# Patient Record
Sex: Female | Born: 1955
Health system: Southern US, Community
[De-identification: ages and names within clinical notes are randomized; demographics above are authoritative.]

## PROBLEM LIST (undated history)

## (undated) DIAGNOSIS — N912 Amenorrhea, unspecified: Secondary | ICD-10-CM

## (undated) DIAGNOSIS — N6009 Solitary cyst of unspecified breast: Secondary | ICD-10-CM

## (undated) DIAGNOSIS — R933 Abnormal findings on diagnostic imaging of other parts of digestive tract: Secondary | ICD-10-CM

## (undated) DIAGNOSIS — N644 Mastodynia: Secondary | ICD-10-CM

## (undated) DIAGNOSIS — D219 Benign neoplasm of connective and other soft tissue, unspecified: Secondary | ICD-10-CM

## (undated) DIAGNOSIS — R102 Pelvic and perineal pain: Secondary | ICD-10-CM

## (undated) DIAGNOSIS — K602 Anal fissure, unspecified: Secondary | ICD-10-CM

## (undated) DIAGNOSIS — Z803 Family history of malignant neoplasm of breast: Secondary | ICD-10-CM

## (undated) DIAGNOSIS — E739 Lactose intolerance, unspecified: Secondary | ICD-10-CM

## (undated) DIAGNOSIS — I10 Essential (primary) hypertension: Secondary | ICD-10-CM

## (undated) HISTORY — DX: Benign neoplasm of connective and other soft tissue, unspecified: D21.9

## (undated) HISTORY — DX: Anal fissure, unspecified: K60.2

## (undated) HISTORY — DX: Solitary cyst of unspecified breast: N60.09

## (undated) HISTORY — DX: Abnormal findings on diagnostic imaging of other parts of digestive tract: R93.3

## (undated) HISTORY — PX: DIAGNOSTIC LAPAROSCOPY: SUR761

## (undated) HISTORY — DX: Mastodynia: N64.4

## (undated) HISTORY — DX: Amenorrhea, unspecified: N91.2

## (undated) HISTORY — DX: Lactose intolerance, unspecified: E73.9

## (undated) HISTORY — DX: Family history of malignant neoplasm of breast: Z80.3

## (undated) HISTORY — DX: Pelvic and perineal pain: R10.2

---
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
## 1997-08-17 ENCOUNTER — Ambulatory Visit (HOSPITAL_COMMUNITY): Admission: RE | Admit: 1997-08-17 | Discharge: 1997-08-17 | Payer: Self-pay | Admitting: Obstetrics and Gynecology

## 1997-08-30 ENCOUNTER — Ambulatory Visit (HOSPITAL_COMMUNITY): Admission: RE | Admit: 1997-08-30 | Discharge: 1997-08-30 | Payer: Self-pay | Admitting: Obstetrics and Gynecology

## 1999-11-16 ENCOUNTER — Encounter: Admission: RE | Admit: 1999-11-16 | Discharge: 1999-11-16 | Payer: Self-pay | Admitting: *Deleted

## 1999-11-16 ENCOUNTER — Other Ambulatory Visit: Admission: RE | Admit: 1999-11-16 | Discharge: 1999-11-16 | Payer: Self-pay | Admitting: *Deleted

## 1999-11-16 ENCOUNTER — Encounter: Payer: Self-pay | Admitting: *Deleted

## 1999-11-16 ENCOUNTER — Encounter (INDEPENDENT_AMBULATORY_CARE_PROVIDER_SITE_OTHER): Payer: Self-pay | Admitting: Specialist

## 2000-06-30 ENCOUNTER — Encounter (HOSPITAL_BASED_OUTPATIENT_CLINIC_OR_DEPARTMENT_OTHER): Payer: Self-pay | Admitting: General Surgery

## 2000-07-02 ENCOUNTER — Ambulatory Visit (HOSPITAL_COMMUNITY): Admission: RE | Admit: 2000-07-02 | Discharge: 2000-07-02 | Payer: Self-pay | Admitting: General Surgery

## 2000-07-02 ENCOUNTER — Encounter (HOSPITAL_BASED_OUTPATIENT_CLINIC_OR_DEPARTMENT_OTHER): Payer: Self-pay | Admitting: General Surgery

## 2000-07-02 ENCOUNTER — Encounter (INDEPENDENT_AMBULATORY_CARE_PROVIDER_SITE_OTHER): Payer: Self-pay | Admitting: *Deleted

## 2000-09-29 ENCOUNTER — Encounter: Admission: RE | Admit: 2000-09-29 | Discharge: 2000-09-29 | Payer: Self-pay | Admitting: Geriatric Medicine

## 2000-09-29 ENCOUNTER — Encounter: Payer: Self-pay | Admitting: Geriatric Medicine

## 2000-12-24 ENCOUNTER — Encounter: Admission: RE | Admit: 2000-12-24 | Discharge: 2000-12-24 | Payer: Self-pay | Admitting: Geriatric Medicine

## 2000-12-24 ENCOUNTER — Encounter: Payer: Self-pay | Admitting: Geriatric Medicine

## 2001-04-15 HISTORY — PX: BRAIN SURGERY: SHX531

## 2001-06-23 ENCOUNTER — Encounter: Payer: Self-pay | Admitting: Geriatric Medicine

## 2001-06-23 ENCOUNTER — Encounter: Admission: RE | Admit: 2001-06-23 | Discharge: 2001-06-23 | Payer: Self-pay | Admitting: Geriatric Medicine

## 2001-10-12 ENCOUNTER — Other Ambulatory Visit: Admission: RE | Admit: 2001-10-12 | Discharge: 2001-10-12 | Payer: Self-pay | Admitting: Obstetrics and Gynecology

## 2001-10-14 ENCOUNTER — Encounter: Payer: Self-pay | Admitting: Obstetrics and Gynecology

## 2001-10-14 ENCOUNTER — Encounter: Admission: RE | Admit: 2001-10-14 | Discharge: 2001-10-14 | Payer: Self-pay | Admitting: Obstetrics and Gynecology

## 2002-07-30 ENCOUNTER — Encounter: Admission: RE | Admit: 2002-07-30 | Discharge: 2002-07-30 | Payer: Self-pay | Admitting: Obstetrics and Gynecology

## 2002-07-30 ENCOUNTER — Encounter: Payer: Self-pay | Admitting: Obstetrics and Gynecology

## 2002-10-04 ENCOUNTER — Encounter: Admission: RE | Admit: 2002-10-04 | Discharge: 2002-10-04 | Payer: Self-pay | Admitting: Geriatric Medicine

## 2002-10-04 ENCOUNTER — Encounter: Payer: Self-pay | Admitting: Geriatric Medicine

## 2003-01-20 ENCOUNTER — Other Ambulatory Visit: Admission: RE | Admit: 2003-01-20 | Discharge: 2003-01-20 | Payer: Self-pay | Admitting: Obstetrics and Gynecology

## 2003-05-16 ENCOUNTER — Encounter: Admission: RE | Admit: 2003-05-16 | Discharge: 2003-05-16 | Payer: Self-pay | Admitting: Geriatric Medicine

## 2003-10-28 ENCOUNTER — Encounter: Admission: RE | Admit: 2003-10-28 | Discharge: 2003-10-28 | Payer: Self-pay | Admitting: Gastroenterology

## 2004-09-30 ENCOUNTER — Emergency Department (HOSPITAL_COMMUNITY): Admission: EM | Admit: 2004-09-30 | Discharge: 2004-09-30 | Payer: Self-pay | Admitting: Family Medicine

## 2004-10-17 ENCOUNTER — Ambulatory Visit (HOSPITAL_COMMUNITY): Admission: RE | Admit: 2004-10-17 | Discharge: 2004-10-17 | Payer: Self-pay | Admitting: General Surgery

## 2005-04-01 ENCOUNTER — Other Ambulatory Visit: Admission: RE | Admit: 2005-04-01 | Discharge: 2005-04-01 | Payer: Self-pay | Admitting: Obstetrics and Gynecology

## 2005-04-02 ENCOUNTER — Encounter: Admission: RE | Admit: 2005-04-02 | Discharge: 2005-04-02 | Payer: Self-pay | Admitting: Obstetrics and Gynecology

## 2005-08-05 ENCOUNTER — Encounter: Admission: RE | Admit: 2005-08-05 | Discharge: 2005-08-05 | Payer: Self-pay | Admitting: Orthopedic Surgery

## 2005-09-17 ENCOUNTER — Encounter: Admission: RE | Admit: 2005-09-17 | Discharge: 2005-09-17 | Payer: Self-pay | Admitting: Geriatric Medicine

## 2005-11-07 ENCOUNTER — Encounter: Admission: RE | Admit: 2005-11-07 | Discharge: 2005-11-07 | Payer: Self-pay | Admitting: Obstetrics and Gynecology

## 2005-11-27 ENCOUNTER — Emergency Department (HOSPITAL_COMMUNITY): Admission: EM | Admit: 2005-11-27 | Discharge: 2005-11-28 | Payer: Self-pay | Admitting: Emergency Medicine

## 2006-05-12 ENCOUNTER — Encounter: Admission: RE | Admit: 2006-05-12 | Discharge: 2006-05-12 | Payer: Self-pay | Admitting: Obstetrics and Gynecology

## 2007-09-21 ENCOUNTER — Encounter: Admission: RE | Admit: 2007-09-21 | Discharge: 2007-09-21 | Payer: Self-pay | Admitting: Obstetrics and Gynecology

## 2008-03-23 ENCOUNTER — Encounter: Admission: RE | Admit: 2008-03-23 | Discharge: 2008-03-23 | Payer: Self-pay | Admitting: Geriatric Medicine

## 2008-09-27 ENCOUNTER — Encounter: Admission: RE | Admit: 2008-09-27 | Discharge: 2008-09-27 | Payer: Self-pay | Admitting: Obstetrics and Gynecology

## 2009-05-18 ENCOUNTER — Encounter: Admission: RE | Admit: 2009-05-18 | Discharge: 2009-05-18 | Payer: Self-pay | Admitting: Internal Medicine

## 2009-07-19 ENCOUNTER — Encounter: Admission: RE | Admit: 2009-07-19 | Discharge: 2009-07-19 | Payer: Self-pay | Admitting: Geriatric Medicine

## 2009-10-02 ENCOUNTER — Encounter: Admission: RE | Admit: 2009-10-02 | Discharge: 2009-10-02 | Payer: Self-pay | Admitting: Obstetrics and Gynecology

## 2009-11-17 ENCOUNTER — Encounter: Admission: RE | Admit: 2009-11-17 | Discharge: 2009-11-17 | Payer: Self-pay | Admitting: Internal Medicine

## 2010-04-13 ENCOUNTER — Encounter
Admission: RE | Admit: 2010-04-13 | Discharge: 2010-04-13 | Payer: Self-pay | Source: Home / Self Care | Attending: Obstetrics and Gynecology | Admitting: Obstetrics and Gynecology

## 2010-04-13 ENCOUNTER — Encounter
Admission: RE | Admit: 2010-04-13 | Discharge: 2010-04-13 | Payer: Self-pay | Source: Home / Self Care | Attending: Geriatric Medicine | Admitting: Geriatric Medicine

## 2010-05-06 ENCOUNTER — Encounter: Payer: Self-pay | Admitting: Geriatric Medicine

## 2010-05-06 ENCOUNTER — Encounter: Payer: Self-pay | Admitting: Obstetrics and Gynecology

## 2010-05-07 ENCOUNTER — Encounter: Payer: Self-pay | Admitting: Geriatric Medicine

## 2010-08-31 NOTE — Op Note (Signed)
[place]  Patient:    Barbara Guerrero, Barbara Guerrero                      MRN: [id number] Proc. Date: 07/02/00 Attending:  Luisa Hart L. Lurene Shadow, M.D.                           Operative Report  PREOPERATIVE DIAGNOSIS:  Fibroadenoma, left breast.  POSTOPERATIVE DIAGNOSIS:  Fibroadenoma, left breast, pathology pending.  OPERATION PERFORMED:  Excisional biopsy of left breast mass.  SURGEON:  Mardene Celeste. Lurene Shadow, M.D.  ASSISTANT:  Nurse.  ANESTHESIA:  General.  INDICATIONS FOR PROCEDURE:  The patient is a 55 year old woman with a nonpalpable left breast mass which on ultrasound was biopsied by core biopsy showing a fibroadenoma; however, because of this patients strong family history of breast cancer in a sister, she elects to have the entire mass removed.  She was brought to the operating room for same.  DESCRIPTION OF PROCEDURE:  Following the induction of anesthesia, the patient was positioned supinely and the left breast was prepped and draped to be included in a sterile operative field.  The incision was made near the region of the localizing needle over the designated region of the mass, deepened through the skin and subcutaneous tissues and carried down to the mass.  The mass was excised in its entirety along with the localizing needle and forwarded for specimen mammography.  Specimen mammography confirms presence of a mass in the excised tissue.  The mass was then forwarded for pathological evaluation and pathology is pending.  Hemostasis was assured with electrocautery.  Sponge, instrument and sharp counts were verified.  The wound was closed in two layers using interrupted 3-0 Vicryl sutures in the deep layers and a running 5-0 Monocryl in the skin.  The wound was then reinforced with Steri-Strips and sterile dressings were applied.  Anesthetic reversed. Patient removed from the operating room to the recovery room in stable condition having tolerated  the procedure well. DD:  07/02/00 TD:  07/02/00 Job: [id number]

## 2011-01-29 ENCOUNTER — Other Ambulatory Visit: Payer: Self-pay | Admitting: Orthopedic Surgery

## 2011-01-29 DIAGNOSIS — M25562 Pain in left knee: Secondary | ICD-10-CM

## 2011-01-30 ENCOUNTER — Ambulatory Visit
Admission: RE | Admit: 2011-01-30 | Discharge: 2011-01-30 | Disposition: A | Discharge status: Home / Self Care | Payer: BC Managed Care – PPO | Source: Ambulatory Visit | Attending: Orthopedic Surgery | Admitting: Orthopedic Surgery

## 2011-01-30 DIAGNOSIS — M25562 Pain in left knee: Secondary | ICD-10-CM

## 2011-03-19 ENCOUNTER — Other Ambulatory Visit: Payer: Self-pay | Admitting: Orthopedic Surgery

## 2011-03-19 ENCOUNTER — Other Ambulatory Visit: Payer: BC Managed Care – PPO

## 2011-03-19 DIAGNOSIS — M79669 Pain in unspecified lower leg: Secondary | ICD-10-CM

## 2011-08-16 ENCOUNTER — Encounter (HOSPITAL_COMMUNITY): Payer: Self-pay | Admitting: Emergency Medicine

## 2011-08-16 ENCOUNTER — Emergency Department (HOSPITAL_COMMUNITY)
Admission: EM | Admit: 2011-08-16 | Discharge: 2011-08-16 | Disposition: A | Discharge status: Home / Self Care | Payer: BC Managed Care – PPO | Source: Home / Self Care | Attending: Family Medicine | Admitting: Family Medicine

## 2011-08-16 DIAGNOSIS — J069 Acute upper respiratory infection, unspecified: Secondary | ICD-10-CM

## 2011-08-16 LAB — POCT RAPID STREP A: Streptococcus, Group A Screen (Direct): NEGATIVE

## 2011-08-16 MED ORDER — AZITHROMYCIN 250 MG PO TABS: 250.0000 mg | ORAL_TABLET | Freq: Every day | ORAL | Status: AC

## 2011-08-16 NOTE — Discharge Instructions (Signed)
Your exam is not concerning for a bacterial infection, and this is most likely viral. I recommend supportive care with fever and pain control with ibuprofen 600 mg every 6 hours or 800 mg every 8 hours; you may alternate with Tylenol (acetaminophen). Use nasal saline, available over the counter in brands such as Ayr, or Arm & Hammer, for nasal congestion and runny nose. If no improvement in symptoms in 48 hours, fill antibiotic prescription. Return to care should your symptoms not improve, or worsen in any way.

## 2011-08-16 NOTE — ED Notes (Signed)
PT HERE WITH SINUS SX SORE THROAT,RUNNY NOSE,PAIN BEHIND EYES AND BODY ACHES THAT STARTED X 4 DYS AGO UNRELIEVED BY OTC BENADRYL,TYLENOL.TEMP 100.4

## 2011-08-16 NOTE — ED Provider Notes (Signed)
History     CSN: [id number]  Arrival date & time 08/16/11  4098   First MD Initiated Contact with Patient 08/16/11 1006      Chief Complaint  Patient presents with  . Sinusitis  . URI    (Consider location/radiation/quality/duration/timing/severity/associated sxs/prior treatment) HPI Comments: Barbara Guerrero presents for evaluation of URI symptoms of rhinorrhea, postnasal drainage, sore throat, and body aches over the last 4 days. She denies any cough. She does report fever, has been taking over-the-counter preparations such as Benadryl and Tylenol with mild relief. She also reports a history of coughing up small like balls; she thinks they come from her tonsils.  Patient is a 56 y.o. female presenting with URI.  URI The primary symptoms include fever, fatigue, sore throat and myalgias. Primary symptoms do not include cough. The current episode started 3 to 5 days ago. This is a new problem. The problem has not changed since onset. The fever began 3 to 5 days ago. The fever has been unchanged since its onset. The maximum temperature recorded prior to her arrival was unknown.  The fatigue began 3 to 5 days ago. The fatigue has been unchanged since its onset.  The sore throat began more than 2 days ago. The sore throat has been unchanged since its onset. The sore throat is mild in intensity. The sore throat is not accompanied by trouble swallowing.  Symptoms associated with the illness include congestion and rhinorrhea. The following treatments were addressed: Acetaminophen was effective.    Past Medical History  Diagnosis Date  . Asthma     Past Surgical History  Procedure Date  . Brain surgery   . Cesarean section     No family history on file.  History  Substance Use Topics  . Smoking status: Never Smoker   . Smokeless tobacco: Not on file  . Alcohol Use: No    OB History    Grav Para Term Preterm Abortions TAB SAB Ect Mult Living                  Review of Systems    Constitutional: Positive for fever and fatigue.  HENT: Positive for congestion, sore throat, rhinorrhea and postnasal drip. Negative for trouble swallowing.   Eyes: Negative.   Respiratory: Negative.  Negative for cough.   Cardiovascular: Negative.   Gastrointestinal: Negative.   Genitourinary: Negative.   Musculoskeletal: Positive for myalgias.  Skin: Negative.   Neurological: Negative.     Allergies  Review of patient's allergies indicates no known allergies.  Home Medications   Current Outpatient Rx  Name Route Sig Dispense Refill  . ALBUTEROL SULFATE HFA 108 (90 BASE) MCG/ACT IN AERS Inhalation Inhale 2 puffs into the lungs every 6 (six) hours as needed.    . AZITHROMYCIN 250 MG PO TABS Oral Take 1 tablet (250 mg total) by mouth daily. Take two tablets on first day, then one tablet each day for four days 6 tablet 0    BP 116/75  Pulse 84  Temp(Src) 100.4 F (38 C) (Oral)  Resp 16  SpO2 94%  Physical Exam  Nursing note and vitals reviewed. Constitutional: She is oriented to person, place, and time. She appears well-developed and well-nourished.  HENT:  Head: Normocephalic and atraumatic.  Right Ear: Tympanic membrane normal.  Left Ear: Tympanic membrane normal.  Mouth/Throat: Uvula is midline, oropharynx is clear and moist and mucous membranes are normal.    Eyes: EOM are normal.  Neck: Normal range of motion.  Cardiovascular: Normal rate and regular rhythm.   Pulmonary/Chest: Effort normal and breath sounds normal. She has no decreased breath sounds. She has no wheezes. She has no rhonchi.  Musculoskeletal: Normal range of motion.  Neurological: She is alert and oriented to person, place, and time.  Skin: Skin is warm and dry.  Psychiatric: Her behavior is normal.    ED Course  Procedures (including critical care time)   Labs Reviewed  POCT RAPID STREP A ([facility] CARE ONLY)   No results found.   1. URI (upper respiratory infection)       MDM   Exam unremarkable; Rapid strep negative; continue supportive care, given delayed rx for azithromycin if no improvement in sx        Renaee Munda, MD 08/16/11 1210

## 2011-11-12 ENCOUNTER — Encounter: Payer: Self-pay | Admitting: Obstetrics and Gynecology

## 2011-11-12 ENCOUNTER — Ambulatory Visit (INDEPENDENT_AMBULATORY_CARE_PROVIDER_SITE_OTHER): Payer: BC Managed Care – PPO | Admitting: Obstetrics and Gynecology

## 2011-11-12 VITALS — BP 122/62 | Ht 63.75 in | Wt 226.0 lb

## 2011-11-12 DIAGNOSIS — D259 Leiomyoma of uterus, unspecified: Secondary | ICD-10-CM

## 2011-11-12 DIAGNOSIS — Z803 Family history of malignant neoplasm of breast: Secondary | ICD-10-CM

## 2011-11-12 DIAGNOSIS — R3915 Urgency of urination: Secondary | ICD-10-CM

## 2011-11-12 DIAGNOSIS — Z124 Encounter for screening for malignant neoplasm of cervix: Secondary | ICD-10-CM

## 2011-11-12 DIAGNOSIS — D219 Benign neoplasm of connective and other soft tissue, unspecified: Secondary | ICD-10-CM

## 2011-11-12 NOTE — Progress Notes (Signed)
AEX VISIT Last Pap: 11/01/2009 WNL: Yes No hx abnormal pap Regular Periods:no Contraception: post menopausal   Monthly Breast exam:yes Tetanus<23yrs:yes Nl.Bladder Function:yes Daily BMs:yes Healthy Diet:yes Calcium:no Mammogram:yes Date of Mammogram: 03/2010 Exercise:no Have often Exercise: n/a Seatbelt: yes Abuse at home: no Stressful work:no Sigmoid-colonoscopy: 2011 WNL  Bone Density: Yes  PCP: [facility] Change in PMH: none.  Known first degree relative (sister ) with breast cancer Change in [facility]: none Subjective:    Barbara Guerrero is a 56 y.o. female, G2P2, who presents for an annual exam.     History   Social History  . Marital Status: Married    Spouse Name: N/A    Number of Children: N/A  . Years of Education: N/A   Social History Main Topics  . Smoking status: Never Smoker   . Smokeless tobacco: Never Used  . Alcohol Use: No  . Drug Use: No  . Sexually Active: Yes    Birth Control/ Protection: Post-menopausal   Other Topics Concern  . None   Social History Narrative  . None    Menstrual cycle:   LMP: No LMP recorded. Patient is postmenopausal.           Cycle: none  The following portions of the patient's history were reviewed and updated as appropriate: allergies, current medications, past family history, past medical history, past social history, past surgical history and problem list.  Review of Systems Pertinent items are noted in HPI. Breast:Negative for breast lump,nipple discharge or nipple retraction Gastrointestinal: Negative for abdominal pain, change in bowel habits or rectal bleeding Urinary:negative   Objective:    BP 122/62  Ht 5' 3.75" (1.619 m)  Wt 226 lb (102.513 kg)  BMI 39.10 kg/m2    Weight:  Wt Readings from Last 1 Encounters:  11/12/11 226 lb (102.513 kg)          BMI: Body mass index is 39.10 kg/(m^2).  General Appearance: Alert, appropriate appearance for age. No acute distress HEENT: Grossly normal Neck /  Thyroid: Supple, no masses, nodes or enlargement Lungs: clear to auscultation bilaterally Back: No CVA tenderness Breast Exam: bilateral fibrocystic changes and No masses or nodes.No dimpling, nipple retraction or discharge. Cardiovascular: Regular rate and rhythm. S1, S2, no murmur Gastrointestinal: Soft, non-tender, no masses or organomegaly Pelvic Exam: External genitalia: normal general appearance Vaginal: normal mucosa without prolapse or lesions Cervix: normal appearance Adnexa: no masses but exam compromised by patient habitus Uterus: upper limits normal size and irregular Rectal: no masses Rectovaginal: no masses Lymphatic Exam: Non-palpable nodes in neck, clavicular, axillary, or inguinal regions  Skin: no rash or abnormalities Neurologic: Normal gait and speech, no tremor  Psychiatric: Alert and oriented, appropriate affect.   Wet Prep:not applicable Urinalysis:not applicable UPT: Not done   Assessment:    asymptomatic fibroids    Plan:   Pap with HR HPV every 3 yrs mammogram return annually or prn        HAYGOOD,VANESSA PMD

## 2011-11-13 LAB — PAP IG AND HPV HIGH-RISK

## 2011-11-18 ENCOUNTER — Other Ambulatory Visit: Payer: Self-pay | Admitting: Obstetrics and Gynecology

## 2011-11-18 DIAGNOSIS — N644 Mastodynia: Secondary | ICD-10-CM

## 2012-04-20 ENCOUNTER — Ambulatory Visit
Admission: RE | Admit: 2012-04-20 | Discharge: 2012-04-20 | Disposition: A | Discharge status: Home / Self Care | Payer: BC Managed Care – PPO | Source: Ambulatory Visit | Attending: Obstetrics and Gynecology | Admitting: Obstetrics and Gynecology

## 2012-04-20 DIAGNOSIS — N644 Mastodynia: Secondary | ICD-10-CM

## 2013-07-13 ENCOUNTER — Other Ambulatory Visit: Payer: Self-pay

## 2013-07-13 DIAGNOSIS — Z1231 Encounter for screening mammogram for malignant neoplasm of breast: Secondary | ICD-10-CM

## 2013-07-22 ENCOUNTER — Ambulatory Visit: Payer: BC Managed Care – PPO

## 2013-08-03 ENCOUNTER — Ambulatory Visit
Admission: RE | Admit: 2013-08-03 | Discharge: 2013-08-03 | Disposition: A | Discharge status: Home / Self Care | Payer: BC Managed Care – PPO | Source: Ambulatory Visit

## 2013-08-03 ENCOUNTER — Encounter (INDEPENDENT_AMBULATORY_CARE_PROVIDER_SITE_OTHER): Payer: Self-pay

## 2013-08-03 DIAGNOSIS — Z1231 Encounter for screening mammogram for malignant neoplasm of breast: Secondary | ICD-10-CM

## 2013-11-28 ENCOUNTER — Encounter (HOSPITAL_COMMUNITY): Payer: Self-pay | Admitting: Emergency Medicine

## 2013-11-28 ENCOUNTER — Emergency Department (HOSPITAL_COMMUNITY)
Admission: EM | Admit: 2013-11-28 | Discharge: 2013-11-28 | Disposition: A | Discharge status: Home / Self Care | Payer: BC Managed Care – PPO | Attending: Emergency Medicine | Admitting: Emergency Medicine

## 2013-11-28 DIAGNOSIS — Z8719 Personal history of other diseases of the digestive system: Secondary | ICD-10-CM | POA: Insufficient documentation

## 2013-11-28 DIAGNOSIS — Z8639 Personal history of other endocrine, nutritional and metabolic disease: Secondary | ICD-10-CM | POA: Diagnosis not present

## 2013-11-28 DIAGNOSIS — Z862 Personal history of diseases of the blood and blood-forming organs and certain disorders involving the immune mechanism: Secondary | ICD-10-CM | POA: Diagnosis not present

## 2013-11-28 DIAGNOSIS — Z8742 Personal history of other diseases of the female genital tract: Secondary | ICD-10-CM | POA: Diagnosis not present

## 2013-11-28 DIAGNOSIS — G44209 Tension-type headache, unspecified, not intractable: Secondary | ICD-10-CM | POA: Diagnosis not present

## 2013-11-28 DIAGNOSIS — J45909 Unspecified asthma, uncomplicated: Secondary | ICD-10-CM | POA: Diagnosis not present

## 2013-11-28 DIAGNOSIS — R51 Headache: Secondary | ICD-10-CM | POA: Insufficient documentation

## 2013-11-28 MED ORDER — ACETAMINOPHEN 500 MG PO TABS
1000.0000 mg | ORAL_TABLET | Freq: Once | ORAL | Status: AC
  Administered 2013-11-28: 1000 mg via ORAL
  Filled 2013-11-28: qty 2

## 2013-11-28 NOTE — ED Notes (Addendum)
Pt reports having a headache for the past three days. Pt states that she has been taking a pain medication prescribed by her orthopedic doctor for the past two weeks. Pt reports that when she closed her eyes on the way over, it improved her pain. Pt denies nausea and emesis. Pt is A/O x4, in NAD, and vitals are WDL.

## 2013-11-28 NOTE — ED Provider Notes (Signed)
CSN: [id number]     Arrival date & time 11/28/13  1845 History   First MD Initiated Contact with Patient 11/28/13 1900     Chief Complaint  Patient presents with  . Headache     (Consider location/radiation/quality/duration/timing/severity/associated sxs/prior Treatment) HPI Barbara Guerrero is a 58 y.o. female who is here for evaluation of headache. The headache started 3 days ago she describes it as a tension-type headache right on top of her head. She cannot remember what brought the HA on. The pain does not wake her up at night. She has not tried anything for the headache, she says that closing her eyes and resting makes it feel better. She said her headache feels much better now than it did when she arrived. She says the only medication she takes now is diclofenac for knee pain as prescribed by her orthopedist.She denies any fevers, nausea, vomiting, visual disturbances, weakness, confusion.   Past Medical History  Diagnosis Date  . Asthma   . Breast cyst   . Fibroids   . Pelvic pain in female   . Barium enema abnormal   . Lactose intolerance   . Mastodynia   . Amenorrhea   . Rectal fissure   . Family history of breast cancer in first degree relative    Past Surgical History  Procedure Laterality Date  . Brain surgery  2003  . Cesarean section  1980/1985  . Breast surgery  1977  . Diagnostic laparoscopy     Family History  Problem Relation Age of Onset  . Hypertension Mother   . Cancer Sister     breast cancer   History  Substance Use Topics  . Smoking status: Never Smoker   . Smokeless tobacco: Never Used  . Alcohol Use: No   OB History   Grav Para Term Preterm Abortions TAB SAB Ect Mult Living   2 2             Review of Systems  Constitutional: Negative for fever and fatigue.  HENT: Negative for sore throat.   Eyes: Negative for visual disturbance.  Respiratory: Negative for shortness of breath.   Cardiovascular: Negative for chest pain.   Gastrointestinal: Negative for abdominal pain.  Musculoskeletal: Negative for neck pain and neck stiffness.  Skin: Negative for rash.  Neurological: Positive for headaches. Negative for weakness and numbness.      Allergies  Shellfish allergy  Home Medications   Prior to Admission medications   Medication Sig Start Date End Date Taking? Authorizing Provider  albuterol (PROVENTIL HFA;VENTOLIN HFA) 108 (90 BASE) MCG/ACT inhaler Inhale 2 puffs into the lungs every 6 (six) hours as needed.    Historical Provider, MD   BP 164/89  Pulse 81  Temp(Src) 98.4 F (36.9 C) (Oral)  Resp 18  SpO2 97% Physical Exam  Nursing note and vitals reviewed. Constitutional: She is oriented to person, place, and time. She appears well-developed and well-nourished. No distress.  HENT:  Head: Normocephalic and atraumatic.  Mouth/Throat: Oropharynx is clear and moist.  Eyes: Conjunctivae and EOM are normal. Pupils are equal, round, and reactive to light. Right eye exhibits no discharge. Left eye exhibits no discharge.  Neck: Normal range of motion.  Cardiovascular: Normal rate, regular rhythm and normal heart sounds.   Pulmonary/Chest: Effort normal and breath sounds normal.  Abdominal: Soft. There is no tenderness.  Neurological: She is alert and oriented to person, place, and time.  Skin: Skin is warm and dry. No rash noted. She  is not diaphoretic.  Psychiatric: She has a normal mood and affect.    ED Course  Procedures (including critical care time) Labs Review Labs Reviewed - No data to display  Imaging Review No results found.   EKG Interpretation None      MDM  Pt resting comfortably in ED. Vitals stable. Description consistent with tension type HA. No concern for stroke, vascular disturbances, trauma, meningitis. Administered Tylenol in ED for symptom relief. Recommended recheck of BP in outpt setting. Final diagnoses:  None   Meds given in ED:  Medications  acetaminophen  (TYLENOL) tablet 1,000 mg (1,000 mg Oral Given 11/28/13 1929)    New Prescriptions   No medications on file   Prior to patient discharge, I discussed and reviewed this case with Dr.Ray      Verl Dicker, PA-C 11/28/13 2010

## 2013-11-28 NOTE — Discharge Instructions (Signed)
Continue to use Tylenol for symptom relief If you feel your headache gets worse, you experience fevers, nausea, vomiting, or experience vision changes- retur General Headache Without Cause A headache is pain or discomfort felt around the head or neck area. The specific cause of a headache may not be found. There are many causes and types of headaches. A few common ones are:  Tension headaches.  Migraine headaches.  Cluster headaches.  Chronic daily headaches. HOME CARE INSTRUCTIONS   Keep all follow-up appointments with your caregiver or any specialist referral.  Only take over-the-counter or prescription medicines for pain or discomfort as directed by your caregiver.  Lie down in a dark, quiet room when you have a headache.  Keep a headache journal to find out what may trigger your migraine headaches. For example, write down:  What you eat and drink.  How much sleep you get.  Any change to your diet or medicines.  Try massage or other relaxation techniques.  Put ice packs or heat on the head and neck. Use these 3 to 4 times per day for 15 to 20 minutes each time, or as needed.  Limit stress.  Sit up straight, and do not tense your muscles.  Quit smoking if you smoke.  Limit alcohol use.  Decrease the amount of caffeine you drink, or stop drinking caffeine.  Eat and sleep on a regular schedule.  Get 7 to 9 hours of sleep, or as recommended by your caregiver.  Keep lights dim if bright lights bother you and make your headaches worse. SEEK MEDICAL CARE IF:   You have problems with the medicines you were prescribed.  Your medicines are not working.  You have a change from the usual headache.  You have nausea or vomiting. SEEK IMMEDIATE MEDICAL CARE IF:   Your headache becomes severe.  You have a fever.  You have a stiff neck.  You have loss of vision.  You have muscular weakness or loss of muscle control.  You start losing your balance or have trouble  walking.  You feel faint or pass out.  You have severe symptoms that are different from your first symptoms. MAKE SURE YOU:   Understand these instructions.  Will watch your condition.  Will get help right away if you are not doing well or get worse. Document Released: 04/01/2005 Document Revised: 06/24/2011 Document Reviewed: 04/17/2011 [facility] Patient Information 2015 [place], [place]. This information is not intended to replace advice given to you by your health care provider. Make sure you discuss any questions you have with your health care provider. n to ED for further evaluation.    Emergency Department Resource Guide 1) Find a Doctor and Pay Out of Pocket Although you won't have to find out who is covered by your insurance plan, it is a good idea to ask around and get recommendations. You will then need to call the office and see if the doctor you have chosen will accept you as a new patient and what types of options they offer for patients who are self-pay. Some doctors offer discounts or will set up payment plans for their patients who do not have insurance, but you will need to ask so you aren't surprised when you get to your appointment.  2) Contact Your Local Health Department Not all health departments have doctors that can see patients for sick visits, but many do, so it is worth a call to see if yours does. If you don't know where your local  health department is, you can check in your phone book. The CDC also has a tool to help you locate your state's health department, and many state websites also have listings of all of their local health departments.  3) Find a [facility] If your illness is not likely to be very severe or complicated, you may want to try a walk in clinic. These are popping up all over the country in pharmacies, drugstores, and shopping centers. They're usually staffed by nurse practitioners or physician assistants that have been trained to treat  common illnesses and complaints. They're usually fairly quick and inexpensive. However, if you have serious medical issues or chronic medical problems, these are probably not your best option.  No Primary Care Doctor: - Call Health Connect at  [phone] - they can help you locate a primary care doctor that  accepts your insurance, provides certain services, etc. - Physician Referral Service- [phone]  Chronic Pain Problems: Organization         Address  Phone   Notes  [facility]  [phone] Patients need to be referred by their primary care doctor.   Medication Assistance: Organization         Address  Phone   Notes  [facility] Medication [facility] [address] [place], [place] [address] [phone] --Must be a resident of [facility] -- Must have NO insurance coverage whatsoever (no Medicaid/ Medicare, etc.) -- The pt. MUST have a primary care doctor that directs their care regularly and follows them in the community   Med[facility]ist  [phone]   [employer]  [phone]    Agencies that provide inexpensive medical care: Organization         Address  Phone   Notes  [place]  [phone]   [facility]    [phone]   [facility] [place], [place] [address] [phone]   [place] [address] [place] [phone]   Planned Parenthood    [phone]   [facility]    [phone]   [place] and [place], [place] Phone:  [phone], Fax:  [phone] Hours of Operation:  9 am - 6 pm, M-F.  Also accepts Medicaid/Medicare and self-pay.  [facility] for [place], [address], [place] Phone: [phone], Fax: [phone]. Hours of Operation:  8:30 am - 5:30 pm, M-F.  Also accepts Medicaid and self-pay.  [facility] [address] [place] Phone: [phone]   [place], [place], [place] [phone], Ext. [phone] Mondays & Thursdays: 7-9 AM.  First 15 patients are seen on a first come, first serve basis.    [place] Providers:  Organization         Address  Phone   Notes  [facility] [address], [place] [phone] Also accepts self-pay patients.  [place], [place]  [phone]   [place], [address], [place] [phone]   [facility] [address], [place] [phone]   Lucianne Lei [address], [place]   [phone] Only accepts [place] Access [place] patients after they  have their name applied to their card.   Self-Pay (no insurance) in [facility]:  Organization         Address  Phone   Notes  Sickle Cell Patients, [facility] [place] [phone]   [facility] [place] [phone]   [facility] [place]  [place], [place], [place] [phone]   [facility]/Dr. Osei-Bonsu  [address], [place] or [address], [place] [phone] Phone number for both [place] and [place] locations is the same.  [facility] [address] [place] [phone]   [facility] [address] [place] or [address] [phone]   [facility] [address] [place] [phone], phone; [phone], fax Sees patients 1st and 3rd Saturday of every month.  Must not qualify for public or private insurance (i.e. Medicaid, Medicare, [place] Health Choice, Veterans' Benefits)  Household income should be no more than 200% of the  poverty level The clinic cannot treat you if you are pregnant or think you are pregnant  Sexually transmitted diseases are not treated at the clinic.    Dental Care: Organization         Address  Phone  Notes  [facility] of [facility] [place] [phone] Accepts children up to age 47 who are enrolled in [place] or [place]; pregnant women with a Medicaid card; and children who have applied for Medicaid or [place] Health Choice, but were declined, whose parents can pay a reduced fee at time of service.  [facility] Department of [facility]  [address], [place] [phone] Accepts children up to age 62 who are enrolled in [place] or [place]; pregnant women with a Medicaid card; and children who have applied for Medicaid or  Health Choice, but were declined, whose parents can pay a reduced fee at time of service.  [facility] Adult Dental Access PROGRAM  [place] [phone] Patients are seen by appointment only. Walk-ins are not accepted. [place] will see patients 34 years of age and older. Monday - Tuesday (8am-5pm) Most Wednesdays (8:30-5pm) $30 per visit, cash only  [facility] Adult Dental Access PROGRAM  [address], [facility] [phone] Patients are seen by appointment only. Walk-ins are not accepted. [place] will see patients 68 years of age and older. One Wednesday Evening (Monthly: Volunteer Based).  $30 per visit, cash only  [place]  [phone] for adults; Children under age 55, call Graduate Pediatric Dentistry at [phone]. Children aged 88-14, please call [phone] to request a pediatric application.  Dental services are provided in all areas of dental care including fillings, crowns and bridges, complete and partial dentures, implants, gum treatment, root canals, and extractions.  Preventive care is also provided. Treatment is provided to both adults and children. Patients are selected via a lottery and there is often a waiting list.   [facility] [address] [place]  [phone] [email or web address], [place], [place] [phone], Ext. [phone] Second and Fourth Thursday of each month, opens at 6:30 AM; Clinic ends at 9 AM.  Patients are seen  on a first-come first-served basis, and a limited number are seen during each clinic.   [facility]  [address] Hillard Danker [place], [place] [phone]   Eligibility Requirements You must have lived in [place], [place], or [place] counties for at least the last three months.   You cannot be eligible for state or federal sponsored [employer], including [employer], [place], or [employer].   You generally cannot be eligible for healthcare insurance through your employer.    How to apply: Eligibility screenings are held every Tuesday and Wednesday afternoon from 1:00 pm until 4:00 pm. You do not need an appointment for the interview!  [facility] [address] [place], [place]   [place]  [place] Department  [place]  [phone]    Behavioral Health Resources in the Community: Intensive Outpatient Programs Organization         Address  Phone  Notes  [place]. [address] [place], [place] [phone]   [facility] Outpatient [address], [place], [place]   ADS: Alcohol & Drug Svcs [address], [place], [place]   [place] [address]  [place], [place] or [phone]   Substance Abuse Resources Organization         Address  Phone  Notes  Alcohol and Drug Services  [phone]   [place]  [phone]   The [place]   Chinita Pester  [phone]   Residential & Outpatient Substance Abuse Program  [phone]   Psychological Services Organization         Address  Phone  Notes  [facility] [place]  [place]  [phone]   [place] [address], [place] or [phone]    Mobile Crisis Teams Organization         Address  Phone  Notes  [facility]atives, Mobile Crisis Care Unit  [phone]   Assertive Psychotherapeutic Services  [address] [place], [place]   [place] [address] [place] [phone]    Self-Help/Support Groups Organization         Address  Phone             Notes  [place] of [place] - variety of support groups  [place] Call for more information  Narcotics Anonymous (NA), Caring Services [address], [employer] [place]  2 meetings at this location   Special educational needs teacher         Address  Phone  Notes  ASAP Residential Treatment [place],    [place]  [phone]   [facility]  [address] [place] [id number], [place], [place]   [place], [address] [phone] Admissions: 8am-3pm M-F  Incentives Substance [place] [address]    [place], [place] [phone]   [facility] [address] Jadene Pierini [place], [place]   [facility] [address]  [place], [place]   Insight Programs - Intensive Outpatient [address], [place], [place]   [facility] ([place] [address]  [place], [place] or [phone]   Residential Treatment Services (RTS) [place], [place]  [place] Medicaid  Fellowship [address],  [place]  [phone] Substance Abuse/Addiction Treatment   [facility] Organization         Address  Phone  Notes  [facility]  [phone]   Domenic Schwab, PhD [address] Arlis Porta [place], [place]   [phone] or [phone]   [place], [place] [phone]   [address], [place], [place] [phone] Insurance/Medicaid/sponsorship through [facility] and Families [address] [place]                                    [place], [place] [phone] [place] [address], [place] [phone]    Dr. Adele Schilder  [phone]   [facility] Dept. 1) [address], [place] 2) [place] 3)  [address], [place] [phone]  [phone]   [place] [phone] or [phone] (After Hours)

## 2013-11-29 NOTE — ED Provider Notes (Signed)
History/physical exam/procedure(s) were performed by non-physician practitioner and as supervising physician I was immediately available for consultation/collaboration. I have reviewed all notes and am in agreement with care and plan.   Shaune Pollack, MD 11/29/13 [phone]

## 2014-02-14 ENCOUNTER — Encounter (HOSPITAL_COMMUNITY): Payer: Self-pay | Admitting: Emergency Medicine

## 2014-07-11 ENCOUNTER — Encounter: Payer: Self-pay | Admitting: Podiatry

## 2014-07-11 ENCOUNTER — Ambulatory Visit (INDEPENDENT_AMBULATORY_CARE_PROVIDER_SITE_OTHER): Payer: BLUE CROSS/BLUE SHIELD | Admitting: Podiatry

## 2014-07-11 ENCOUNTER — Ambulatory Visit (INDEPENDENT_AMBULATORY_CARE_PROVIDER_SITE_OTHER): Payer: BLUE CROSS/BLUE SHIELD

## 2014-07-11 VITALS — BP 140/81 | HR 81 | Resp 12

## 2014-07-11 DIAGNOSIS — R52 Pain, unspecified: Secondary | ICD-10-CM | POA: Diagnosis not present

## 2014-07-11 DIAGNOSIS — M7741 Metatarsalgia, right foot: Secondary | ICD-10-CM

## 2014-07-11 DIAGNOSIS — M722 Plantar fascial fibromatosis: Secondary | ICD-10-CM | POA: Diagnosis not present

## 2014-07-11 NOTE — Progress Notes (Signed)
   Subjective:    Patient ID: Barbara Guerrero, female    DOB: Nov 29, 1955, 59 y.o.   MRN: [id number]  HPI  N- ACHING L-LT MEDIAL SIDE OF THE HEEL D-2 WEEKS O-SUDDENLY C-WORSE A-PRESSURE T-ICE, STRETCHING   N-UNCOMFORTABLE L-RT FOOT BALL OF THE FOOT D-? O-SUDDENLY C-SAME A-WEARING SHOES T-NONE  Patient has a history of knee pain and has had one cortisone injection into the knee today. She admits to having a painful gait pattern resulting from the pain She also has purchase systems sole over-the-counter soft arch pads which is wearing in her shoes  Review of Systems  Allergic/Immunologic: Positive for food allergies.       Objective:   Physical Exam  Orientated 3 patient presents with her daughter who is present in the treatment room today  Vascular: DP pulses 2/4 bilaterally PT pulses 2/4 bilaterally Capillary reflex immediate bilaterally  Neurological: Sensation to 10 g monofilament wire intact 5/5 bilaterally Vibratory sensation intact bilaterally Ankle reflex equal and reactive bilaterally  Dermatological: Texture and turgor within normal limits  Musculoskeletal: Palpable tenderness medial plantar fascial area in the left heel without any palpable lesions Mild palpable tenderness second and third right intermetatarsal spaces without any palpable lesions Mild palpable tenderness plantar subsecond MPJ right without any palpable lesions  There is no restriction ankle, subtalar, midtarsal joints bilaterally  X-ray examination weightbearing right foot  Intact bony structure without fracture and/or dislocation Inferior calcaneal spur HAV deformity Bone density appears adequate  Radiographic impression: No acute bony abnormality noted in the right foot   X-ray examination weightbearing left foot  Intact bony structure without fracture and/or dislocation Small inferior calcaneal spur Metatarsus adductus Bone density appears adequate  Radiographic  impression: No acute bony abnormality noted left foot     Result History        Assessment & Plan:   Assessment: Satisfactory neurovascular status bilaterally Plantar fasciitis left Metatarsalgia right versus the getting symptoms of neuroma second and third intermetatarsal space right Gait disturbance associated with painful right knee and plantar fasciitis left  Plan: Patient has had steroid injection for knee pain today and I will defer on any further steroid injections for the plantar fasciitis left We discussed the findings of the examination today Discuss in nature of her problems associated with plantar fasciitis left and altered gait pattern I attached a surgical felt pads to the existing shoe insoles for metatarsal raise 2-4 bilaterally Discuss correction shoeing and stretching  Reappoint 2 weeks

## 2014-07-11 NOTE — Patient Instructions (Signed)
Bent - Knee Calf Stretch  1) Stand an arm's length away from a wall. Place the palms of your hands on the wall. Step forward about 12 inches with the opposite foot.  2) Keeping toes pointed forward and both heels on the floor, bend both knees and lean forward. Hold this position for 60 seconds. Don't arch your back and don't hunch your shoulders.  3) Repeat this twice.  DO THIS STRETCHING TECHNIQUE 3 TIMES A DAY.   Stretching Exercises before Standing      Pull your toes up toward your nose and hold for 1 minute before standing.  A towel can assist with this exercise if you put the towel under the ball of your foot. This exercise reduces the intense    pain associated when changing from a seated to a standing position. This stretch can usually be the most beneficial if done before getting out of bed in the mornings. Plantar Fasciitis Plantar fasciitis is a common condition that causes foot pain. It is soreness (inflammation) of the band of tough fibrous tissue on the bottom of the foot that runs from the heel bone (calcaneus) to the ball of the foot. The cause of this soreness may be from excessive standing, poor fitting shoes, running on hard surfaces, being overweight, having an abnormal walk, or overuse (this is common in runners) of the painful foot or feet. It is also common in aerobic exercise dancers and ballet dancers. SYMPTOMS  Most people with plantar fasciitis complain of:  Severe pain in the morning on the bottom of their foot especially when taking the first steps out of bed. This pain recedes after a few minutes of walking.  Severe pain is experienced also during walking following a long period of inactivity.  Pain is worse when walking barefoot or up stairs DIAGNOSIS   Your caregiver will diagnose this condition by examining and feeling your foot.  Special tests such as X-rays of your foot, are usually not needed. PREVENTION   Consult a sports medicine professional  before beginning a new exercise program.  Walking programs offer a good workout. With walking there is a lower chance of overuse injuries common to runners. There is less impact and less jarring of the joints.  Begin all new exercise programs slowly. If problems or pain develop, decrease the amount of time or distance until you are at a comfortable level.  Wear good shoes and replace them regularly.  Stretch your foot and the heel cords at the back of the ankle (Achilles tendon) both before and after exercise.  Run or exercise on even surfaces that are not hard. For example, asphalt is better than pavement.  Do not run barefoot on hard surfaces.  If using a treadmill, vary the incline.  Do not continue to workout if you have foot or joint problems. Seek professional help if they do not improve. HOME CARE INSTRUCTIONS   Avoid activities that cause you pain until you recover.  Use ice or cold packs on the problem or painful areas after working out.  Only take over-the-counter or prescription medicines for pain, discomfort, or fever as directed by your caregiver.  Soft shoe inserts or athletic shoes with air or gel sole cushions may be helpful.  If problems continue or become more severe, consult a sports medicine caregiver or your own health care provider. Cortisone is a potent anti-inflammatory medication that may be injected into the painful area. You can discuss this treatment with your caregiver. MAKE   SURE YOU:   Understand these instructions.  Will watch your condition.  Will get help right away if you are not doing well or get worse. Document Released: 12/25/2000 Document Revised: 06/24/2011 Document Reviewed: 02/24/2008 ExitCare Patient Information 2015 [employer]. This information is not intended to replace advice given to you by your health care provider. Make sure you discuss any questions you have with your health care provider.  

## 2014-07-25 ENCOUNTER — Ambulatory Visit: Payer: BLUE CROSS/BLUE SHIELD | Admitting: Podiatry

## 2014-07-27 ENCOUNTER — Encounter: Payer: Self-pay | Admitting: Podiatry

## 2014-07-27 ENCOUNTER — Ambulatory Visit (INDEPENDENT_AMBULATORY_CARE_PROVIDER_SITE_OTHER): Payer: BLUE CROSS/BLUE SHIELD | Admitting: Podiatry

## 2014-07-27 VITALS — BP 123/77 | HR 79 | Resp 14

## 2014-07-27 DIAGNOSIS — M722 Plantar fascial fibromatosis: Secondary | ICD-10-CM | POA: Diagnosis not present

## 2014-07-27 MED ORDER — DICLOFENAC SODIUM 75 MG PO TBEC: 75.0000 mg | DELAYED_RELEASE_TABLET | Freq: Two times a day (BID) | ORAL | Status: DC

## 2014-07-27 NOTE — Patient Instructions (Signed)
Plantar Fasciitis  Plantar fasciitis is a common condition that causes foot pain. It is soreness (inflammation) of the band of tough fibrous tissue on the bottom of the foot that runs from the heel bone (calcaneus) to the ball of the foot. The cause of this soreness may be from excessive standing, poor fitting shoes, running on hard surfaces, being overweight, having an abnormal walk, or overuse (this is common in runners) of the painful foot or feet. It is also common in aerobic exercise dancers and ballet dancers.  SYMPTOMS   Most people with plantar fasciitis complain of:   Severe pain in the morning on the bottom of their foot especially when taking the first steps out of bed. This pain recedes after a few minutes of walking.   Severe pain is experienced also during walking following a long period of inactivity.   Pain is worse when walking barefoot or up stairs  DIAGNOSIS    Your caregiver will diagnose this condition by examining and feeling your foot.   Special tests such as X-rays of your foot, are usually not needed.  PREVENTION    Consult a sports medicine professional before beginning a new exercise program.   Walking programs offer a good workout. With walking there is a lower chance of overuse injuries common to runners. There is less impact and less jarring of the joints.   Begin all new exercise programs slowly. If problems or pain develop, decrease the amount of time or distance until you are at a comfortable level.   Wear good shoes and replace them regularly.   Stretch your foot and the heel cords at the back of the ankle (Achilles tendon) both before and after exercise.   Run or exercise on even surfaces that are not hard. For example, asphalt is better than pavement.   Do not run barefoot on hard surfaces.   If using a treadmill, vary the incline.   Do not continue to workout if you have foot or joint problems. Seek professional help if they do not improve.  HOME CARE INSTRUCTIONS     Avoid activities that cause you pain until you recover.   Use ice or cold packs on the problem or painful areas after working out.   Only take over-the-counter or prescription medicines for pain, discomfort, or fever as directed by your caregiver.   Soft shoe inserts or athletic shoes with air or gel sole cushions may be helpful.   If problems continue or become more severe, consult a sports medicine caregiver or your own health care provider. Cortisone is a potent anti-inflammatory medication that may be injected into the painful area. You can discuss this treatment with your caregiver.  MAKE SURE YOU:    Understand these instructions.   Will watch your condition.   Will get help right away if you are not doing well or get worse.  Document Released: 12/25/2000 Document Revised: 06/24/2011 Document Reviewed: 02/24/2008  [employer] Patient Information 2015 [employer]. This information is not intended to replace advice given to you by your health care provider. Make sure you discuss any questions you have with your health care provider.

## 2014-07-28 NOTE — Progress Notes (Signed)
Patient ID: Barbara Guerrero, female   DOB: 09/08/1955, 59 y.o.   MRN: [id number]  Subjective: Patient presents for follow-up care for plantar fasciitis left. At this time the left heel is still tender, however, improving with shoeing and stretching  Objective: There is palpable tenderness medial plantar fascial insertional area left without any palpable lesions. There is no surrounding erythema, edema noted in the area  Assessment: Plantar fasciitis left  Plan: I offered patient Kenalog injection and patient declined injection Rx diclofenac. Sodium 75 mg by mouth twice a day 30 days Maintain stretching and athletic style shoes  Reappoint at patient's request

## 2014-09-19 ENCOUNTER — Telehealth: Payer: Self-pay | Admitting: *Deleted

## 2014-09-19 NOTE — Telephone Encounter (Signed)
Left message asking pt to call with the name of the pharmacy and I would change the ordering pharmacy.

## 2014-09-19 NOTE — Telephone Encounter (Signed)
-----   Message from Clarene Reamer sent at 09/19/2014  2:02 PM EDT ----- Regarding: RX PHARMACY CHANGE PT WOULD LIKE TO HAVE LAST RX TO BE SENT TO [facility] INSTEAD OF WHATEVER PHARM IS LISTED. SHE NEEDS THE RS SENT TO THEM SHE STATES SHE HAS NEVER RECEIVED IT.

## 2014-12-01 ENCOUNTER — Encounter: Payer: Self-pay | Admitting: Podiatry

## 2014-12-01 ENCOUNTER — Ambulatory Visit (INDEPENDENT_AMBULATORY_CARE_PROVIDER_SITE_OTHER): Payer: BLUE CROSS/BLUE SHIELD | Admitting: Podiatry

## 2014-12-01 VITALS — BP 120/81 | HR 75 | Resp 16

## 2014-12-01 DIAGNOSIS — M722 Plantar fascial fibromatosis: Secondary | ICD-10-CM

## 2014-12-01 MED ORDER — TRIAMCINOLONE ACETONIDE 10 MG/ML IJ SUSP
10.0000 mg | Freq: Once | INTRAMUSCULAR | Status: AC
  Administered 2014-12-01: 10 mg

## 2014-12-01 MED ORDER — DICLOFENAC SODIUM 75 MG PO TBEC: 75.0000 mg | DELAYED_RELEASE_TABLET | Freq: Two times a day (BID) | ORAL | Status: DC

## 2014-12-01 NOTE — Progress Notes (Signed)
Subjective:     Patient ID: Barbara Guerrero, female   DOB: [birth year], 59 y.o.   MRN: [id number]  HPI patient presents with significant heel pain left of several months duration. Does not remember an injury that occurred. Patient has had previous treatment here including orthotics stretching exercises physical therapy and symptoms continue to give trouble both during the day and when waking up or after periods of sitting   Review of Systems     Objective:   Physical Exam Neurovascular status intact muscle strength adequate with exquisite discomfort in the plantar aspect of the left heel with inflammation and fluid in both the medial and central band of the fascia moderate depression of the arch noted upon weightbearing    Assessment:     Acute plantar fasciitis left with moderate depression of the arch and pain    Plan:     Condition reviewed with patient and discussed. Today I went ahead and I injected the plantar fascia 3 mg Kenalog 5 mg Xylocaine and dispensed fascial brace for daily usage. Do to intense night pain I did dispense a night splint with instructions on sleeping with this and patient will be reevaluated again in 4 weeks or earlier if any issues were to occur

## 2014-12-01 NOTE — Patient Instructions (Addendum)

## 2014-12-22 ENCOUNTER — Ambulatory Visit: Payer: BLUE CROSS/BLUE SHIELD | Admitting: Podiatry

## 2015-01-04 ENCOUNTER — Ambulatory Visit: Payer: BLUE CROSS/BLUE SHIELD | Admitting: Podiatry

## 2015-01-17 ENCOUNTER — Ambulatory Visit (INDEPENDENT_AMBULATORY_CARE_PROVIDER_SITE_OTHER): Payer: BLUE CROSS/BLUE SHIELD | Admitting: Allergy and Immunology

## 2015-01-17 ENCOUNTER — Other Ambulatory Visit: Payer: Self-pay | Admitting: Obstetrics and Gynecology

## 2015-01-17 ENCOUNTER — Encounter: Payer: Self-pay | Admitting: Allergy and Immunology

## 2015-01-17 VITALS — BP 122/84 | HR 72 | Temp 97.6°F | Resp 16

## 2015-01-17 DIAGNOSIS — J4530 Mild persistent asthma, uncomplicated: Secondary | ICD-10-CM

## 2015-01-17 DIAGNOSIS — T7800XD Anaphylactic reaction due to unspecified food, subsequent encounter: Secondary | ICD-10-CM | POA: Diagnosis not present

## 2015-01-17 DIAGNOSIS — J018 Other acute sinusitis: Secondary | ICD-10-CM

## 2015-01-17 DIAGNOSIS — Z1231 Encounter for screening mammogram for malignant neoplasm of breast: Secondary | ICD-10-CM

## 2015-01-17 MED ORDER — AZITHROMYCIN 500 MG PO TABS: 500.0000 mg | ORAL_TABLET | Freq: Every day | ORAL | Status: DC

## 2015-01-17 MED ORDER — ALBUTEROL SULFATE 108 (90 BASE) MCG/ACT IN AEPB: 2.0000 | INHALATION_SPRAY | RESPIRATORY_TRACT | Status: DC | PRN

## 2015-01-17 MED ORDER — EPINEPHRINE 0.3 MG/0.3ML IJ SOAJ: 0.3000 mg | Freq: Once | INTRAMUSCULAR | Status: DC

## 2015-01-17 NOTE — Addendum Note (Signed)
Addended by: Jiles Prows on: 01/17/2015 07:41 PM   Modules accepted: Miquel Dunn

## 2015-01-17 NOTE — Patient Instructions (Signed)
  1. Prednisone 10mg  one tablet one time per day for three days  2. Azithromycin 500mg  one tablet one time a day for three days  3. Nasal saline, OTC antihistamine, OTC ibuprofen, and OTC mucinex DM  4. Proair respiclick (with coupon) two pufs every 4-6 hours if needed  5. Epi-Pen for allergy reaction (Shellfish)  6. When better get a flu vaccine  7. Return in one year or earlier if there is a problem.

## 2015-01-17 NOTE — Progress Notes (Signed)
[facility]  Follow-up Note  Subjective  Barbara Guerrero is a 59 y.o. female who returns to the [facility] in re-evaluation of the following:  HPI Comments: . 1. Well controlled mild persistent asthma I have not seen Barbara Guerrero in this clinic since 2014 although she did visit with Dr. Shaune Leeks last spring for an exacerbation. Since that point in time she feels like she is doing well, has not had any exacerbations requiring steroids, does not have a limitation with exercise, and rarely uses her SABA. She is not on any controller agent.    2. Other acute sinusitis Barbara Guerrero has developed sore throat, nasal congestion, head fullness, ugly yellow green nasal discharge and anosmia with slight cough over the past 7-10 days. She is not improving.   3. 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 She needs an Epi-Pen prescription.      Current outpatient prescriptions:  .  acetaminophen (TYLENOL) 500 MG tablet, Take 1,000 mg by mouth every 6 (six) hours as needed for headache., Disp: , Rfl:  .  albuterol (PROVENTIL HFA;VENTOLIN HFA) 108 (90 BASE) MCG/ACT inhaler, Inhale 2 puffs into the lungs every 6 (six) hours as needed for wheezing or shortness of breath. , Disp: , Rfl:  .  Cyanocobalamin (VITAMIN B 12 PO), Take 1 tablet by mouth daily., Disp: , Rfl:  .  etodolac (LODINE) 400 MG tablet, Take 400 mg by mouth as needed. , Disp: , Rfl:  .  Albuterol Sulfate (PROAIR RESPICLICK) 284 (90 BASE) MCG/ACT AEPB, Inhale 2 puffs into the lungs every 4 (four) hours as needed., Disp: 1 each, Rfl: 1 .  azithromycin (ZITHROMAX) 500 MG tablet, Take 1 tablet (500 mg total) by mouth daily., Disp: 3 tablet, Rfl: 0 .  diclofenac (VOLTAREN) 75 MG EC tablet, Take 1 tablet (75 mg total) by mouth 2 (two) times daily. (Patient not taking: Reported on 01/17/2015), Disp: 60 tablet, Rfl: 1 .  EPINEPHrine (EPIPEN 2-PAK) 0.3 mg/0.3 mL IJ SOAJ  injection, Inject 0.3 mLs (0.3 mg total) into the muscle once., Disp: 1 Device, Rfl: 1 .  Menthol, Topical Analgesic, (BIOFREEZE EX), Apply 1 application topically daily., Disp: , Rfl:  .  OVER THE COUNTER MEDICATION, Take 1 tablet by mouth daily., Disp: , Rfl:   Meds ordered this encounter  Medications  . azithromycin (ZITHROMAX) 500 MG tablet    Sig: Take 1 tablet (500 mg total) by mouth daily.    Dispense:  3 tablet    Refill:  0  . Albuterol Sulfate (PROAIR RESPICLICK) 132 (90 BASE) MCG/ACT AEPB    Sig: Inhale 2 puffs into the lungs every 4 (four) hours as needed.    Dispense:  1 each    Refill:  1    Pt has coupon for free drug  . EPINEPHrine (EPIPEN 2-PAK) 0.3 mg/0.3 mL IJ SOAJ injection    Sig: Inject 0.3 mLs (0.3 mg total) into the muscle once.    Dispense:  1 Device    Refill:  1    Allergies  Allergen Reactions  . Shellfish Allergy Itching    Review of Systems  Constitutional: Positive for malaise/fatigue. Negative for fever and chills.  HENT: Positive for congestion and sore throat. Negative for ear pain, hearing loss, nosebleeds and tinnitus.   Eyes: Negative for redness.  Respiratory: Negative for cough, sputum production, shortness of breath and wheezing.   Cardiovascular: Negative for chest pain and leg swelling.  Gastrointestinal: Negative for heartburn, nausea and vomiting.  Skin: Negative for itching and rash.  Neurological: Positive for headaches. Negative for dizziness.     Objective:   Filed Vitals:   01/17/15 1118  BP: 122/84  Pulse: 72  Temp: 97.6 F (36.4 C)  Resp: 16    Physical Exam  Constitutional: She is oriented to person, place, and time and well-developed, well-nourished, and in no distress. No distress.  HENT:  Right Ear: External ear normal.  Left Ear: External ear normal.  Nose: Mucosal edema and rhinorrhea present. No nose lacerations, sinus tenderness, nasal deformity or septal deviation.  Mouth/Throat: Oropharynx is clear and  moist.  Eyes: Conjunctivae are normal.  Neck: No JVD present. No tracheal deviation present. No thyromegaly present.  Cardiovascular: Normal rate, regular rhythm and normal heart sounds.  Exam reveals no gallop.   No murmur heard. Pulmonary/Chest: No stridor. No respiratory distress. She has no wheezes. She has no rales. She exhibits no tenderness.  Musculoskeletal: She exhibits no edema.  Lymphadenopathy:    She has no cervical adenopathy.  Neurological: She is alert and oriented to person, place, and time.  Skin: No rash noted. She is not diaphoretic. No erythema. No pallor.    Diagnostics:    Spirometry was performed and demonstrated an FEV1 of 1.85 at 74 % of predicted.     Assessment and Plan:   1. Well controlled mild persistent asthma   2. Other acute sinusitis     Patient Instructions   1. Prednisone 10mg  one tablet one time per day for three days  2. Azithromycin 500mg  one tablet one time a day for three days  3. Nasal saline, OTC antihistamine, OTC ibuprofen, and OTC mucinex DM  4. Proair respiclick (with coupon) two pufs every 4-6 hours if needed  5. Epi-Pen for allergy reaction (Shellfish)  6. When better get a flu vaccine  7. Return in one year or earlier if there is a problem.     Allena Katz, MD [place]

## 2015-01-25 ENCOUNTER — Ambulatory Visit
Admission: RE | Admit: 2015-01-25 | Discharge: 2015-01-25 | Disposition: A | Discharge status: Home / Self Care | Payer: BLUE CROSS/BLUE SHIELD | Source: Ambulatory Visit | Attending: Obstetrics and Gynecology | Admitting: Obstetrics and Gynecology

## 2015-01-25 DIAGNOSIS — Z1231 Encounter for screening mammogram for malignant neoplasm of breast: Secondary | ICD-10-CM

## 2015-06-28 ENCOUNTER — Encounter: Payer: Self-pay | Admitting: Podiatry

## 2015-06-28 ENCOUNTER — Ambulatory Visit (INDEPENDENT_AMBULATORY_CARE_PROVIDER_SITE_OTHER): Payer: BLUE CROSS/BLUE SHIELD | Admitting: Podiatry

## 2015-06-28 ENCOUNTER — Ambulatory Visit (INDEPENDENT_AMBULATORY_CARE_PROVIDER_SITE_OTHER): Payer: BLUE CROSS/BLUE SHIELD

## 2015-06-28 VITALS — BP 140/88 | HR 89 | Resp 16

## 2015-06-28 DIAGNOSIS — M79671 Pain in right foot: Secondary | ICD-10-CM

## 2015-06-28 DIAGNOSIS — M79672 Pain in left foot: Secondary | ICD-10-CM | POA: Diagnosis not present

## 2015-06-28 DIAGNOSIS — M722 Plantar fascial fibromatosis: Secondary | ICD-10-CM | POA: Diagnosis not present

## 2015-06-28 DIAGNOSIS — M779 Enthesopathy, unspecified: Secondary | ICD-10-CM | POA: Diagnosis not present

## 2015-06-28 MED ORDER — TRIAMCINOLONE ACETONIDE 10 MG/ML IJ SUSP
10.0000 mg | Freq: Once | INTRAMUSCULAR | Status: AC
  Administered 2015-06-28: 10 mg

## 2015-06-28 NOTE — Progress Notes (Signed)
Subjective:     Patient ID: Barbara Guerrero, female   DOB: [birth year], 60 y.o.   MRN: [id number]  HPI patient states that she dropped a small sledgehammer on her left foot and she's had a lot of pain in her left heel that's been chronic and is developed a lot of pain on the inside of her right foot that makes it hard to walk   Review of Systems     Objective:   Physical Exam Neurovascular status found to be intact muscle strength adequate range of motion within normal limits with exquisite discomfort in the left posterior tibial insertion right and in the plantar aspect of the left heel. Patient is noted to have good digital perfusion is well oriented 3    Assessment:     Posterior tibial tendinitis right acute in nature with no indication of muscle dysfunction and plantar fasciitis left with forefoot pain secondary to trauma    Plan:     H&P x-rays reviewed with patient and at this time I went ahead and I carefully injected the insertion posterior tib 3 mg Kenalog 5 mg Xylocaine dispensed fascial brace right and carefully injected the plantar aspect left heel 3 mg Kenalog 5 mg Xylocaine. Reappoint to recheck  X-ray report indicated no signs of fracture from sledgehammer and mild irritation around the navicular right but no indications of other pathology

## 2015-07-13 ENCOUNTER — Ambulatory Visit: Payer: BLUE CROSS/BLUE SHIELD | Admitting: Podiatry

## 2015-07-19 ENCOUNTER — Ambulatory Visit: Payer: BLUE CROSS/BLUE SHIELD | Admitting: Podiatry

## 2016-01-01 ENCOUNTER — Encounter (HOSPITAL_BASED_OUTPATIENT_CLINIC_OR_DEPARTMENT_OTHER): Payer: Self-pay | Admitting: *Deleted

## 2016-01-01 ENCOUNTER — Emergency Department (HOSPITAL_BASED_OUTPATIENT_CLINIC_OR_DEPARTMENT_OTHER)
Admission: EM | Admit: 2016-01-01 | Discharge: 2016-01-01 | Disposition: A | Discharge status: Home / Self Care | Payer: BLUE CROSS/BLUE SHIELD | Attending: Emergency Medicine | Admitting: Emergency Medicine

## 2016-01-01 DIAGNOSIS — Z7951 Long term (current) use of inhaled steroids: Secondary | ICD-10-CM | POA: Diagnosis not present

## 2016-01-01 DIAGNOSIS — T7840XA Allergy, unspecified, initial encounter: Secondary | ICD-10-CM | POA: Diagnosis present

## 2016-01-01 DIAGNOSIS — R0981 Nasal congestion: Secondary | ICD-10-CM | POA: Insufficient documentation

## 2016-01-01 DIAGNOSIS — J45909 Unspecified asthma, uncomplicated: Secondary | ICD-10-CM | POA: Diagnosis not present

## 2016-01-01 DIAGNOSIS — J029 Acute pharyngitis, unspecified: Secondary | ICD-10-CM | POA: Diagnosis not present

## 2016-01-01 MED ORDER — CETIRIZINE HCL 10 MG PO TABS: 10.0000 mg | ORAL_TABLET | Freq: Every day | ORAL | 0 refills | Status: DC

## 2016-01-01 MED ORDER — OXYMETAZOLINE HCL 0.05 % NA SOLN: 1.0000 | Freq: Two times a day (BID) | NASAL | 0 refills | Status: DC

## 2016-01-01 MED ORDER — PSEUDOEPHEDRINE HCL 60 MG PO TABS: 60.0000 mg | ORAL_TABLET | ORAL | 0 refills | Status: DC | PRN

## 2016-01-01 NOTE — ED Notes (Signed)
MD at bedside. 

## 2016-01-01 NOTE — ED Notes (Addendum)
It is difficult to determine what is pt's chief complaint.  She appears to be suffering from allergies as she has watery, red eyes and congestion.  Pt also complains that she stepped on what she states is an ant hill last Wednesday.  Checked pt's left foot and there does not appear to be any bites or redness to foot anymore.  Pt has been taking benadryl with mild relief.  She seems to attribute her symptoms to the ant bites. Pt also prefers to see an MD instead of a midlevel.  Informed pt that it may take a little longer to be seen by the doctor and she verbalized understanding.

## 2016-01-01 NOTE — Discharge Instructions (Signed)
Follow-up closely with your PCP. Return for worsening symptoms, including difficulty breathing, fever, or any other symptoms concerning to you.

## 2016-01-01 NOTE — ED Notes (Signed)
Pt verbalizes understanding of d/c instructions and denies any further need at this time. 

## 2016-01-01 NOTE — ED Triage Notes (Signed)
Pt states that she thinks she is having an allergic reaction. Pt states that this has been going on since Wednesday (5 days ago when she stepped on an ant mound and was bitten several times).  Pt has had a runny nose and appears to have cold symptoms which she attributes to allergic reaction. Pt also has left root swelling.  No facial swelling, no sob.

## 2016-01-01 NOTE — ED Provider Notes (Signed)
[facility] Provider Note   CSN: [id number] Arrival date & time: 01/01/16  2019   By signing my name below, I, Barbara Guerrero, attest that this documentation has been prepared under the direction and in the presence of Forde Dandy, MD . Electronically Signed: Estanislado Guerrero, Scribe. 01/01/2016. 11:11 PM.   History   Chief Complaint Chief Complaint  Patient presents with  . Allergic Reaction    The history is provided by the patient. No language interpreter was used.   HPI Comments:  Barbara Guerrero is a 60 y.o. female with PMHx of asthma who presents to the Emergency Department, here because she believes that she is having an allergic reaction. Pt reports associated congestion, rhinorrhea, sore throat, mild shortness of breath. Pt states that these symptoms have been ongoing for 5 days. Pt states that she has had adverse reactions to colored drinks and reports drinking a colored drink last night. Also thinks with weather changes, causing her allergic reaction. Pt took benadryl and her inhaler today with no relief. Pt denies sick contact, fever, abdominal pain, nausea, vomiting, cough, chest pain.   Past Medical History:  Diagnosis Date  . Amenorrhea   . Asthma   . Barium enema abnormal   . Breast cyst   . Family history of breast cancer in first degree relative   . Fibroids   . Lactose intolerance   . Mastodynia   . Pelvic pain in female   . Rectal fissure     Patient Active Problem List   Diagnosis Date Noted  . Family history of breast cancer in first degree relative     Past Surgical History:  Procedure Laterality Date  . BRAIN SURGERY  2003  . BREAST SURGERY  1977  . CESAREAN SECTION  1980/1985  . DIAGNOSTIC LAPAROSCOPY      OB History    Gravida Para Term Preterm AB Living   2 2           SAB TAB Ectopic Multiple Live Births                   Home Medications    Prior to Admission medications   Medication Sig Start Date End Date  Taking? Authorizing Provider  acetaminophen (TYLENOL) 500 MG tablet Take 1,000 mg by mouth every 6 (six) hours as needed for headache.    Historical Provider, MD  albuterol (PROVENTIL HFA;VENTOLIN HFA) 108 (90 BASE) MCG/ACT inhaler Inhale 2 puffs into the lungs every 6 (six) hours as needed for wheezing or shortness of breath.     Historical Provider, MD  Albuterol Sulfate (PROAIR RESPICLICK) 123XX123 (90 BASE) MCG/ACT AEPB Inhale 2 puffs into the lungs every 4 (four) hours as needed. 01/17/15   Jiles Prows, MD  cetirizine (ZYRTEC ALLERGY) 10 MG tablet Take 1 tablet (10 mg total) by mouth daily. 01/01/16   Forde Dandy, MD  Cyanocobalamin (VITAMIN B 12 PO) Take 1 tablet by mouth daily.    Historical Provider, MD  diclofenac (VOLTAREN) 75 MG EC tablet Take 1 tablet (75 mg total) by mouth 2 (two) times daily. 07/27/14   Gean Birchwood, DPM  EPINEPHrine (EPIPEN 2-PAK) 0.3 mg/0.3 mL IJ SOAJ injection Inject 0.3 mLs (0.3 mg total) into the muscle once. 01/17/15   Jiles Prows, MD  etodolac (LODINE) 400 MG tablet Take 400 mg by mouth as needed.     Historical Provider, MD  Menthol, Topical Analgesic, (BIOFREEZE EX) Apply 1 application topically  daily.    Historical Provider, MD  OVER THE COUNTER MEDICATION Take 1 tablet by mouth daily.    Historical Provider, MD  pseudoephedrine (SUDAFED) 60 MG tablet Take 1 tablet (60 mg total) by mouth every 4 (four) hours as needed for congestion. 01/01/16   Forde Dandy, MD    Family History Family History  Problem Relation Age of Onset  . Hypertension Mother   . Cancer Sister     breast cancer    Social History Social History  Substance Use Topics  . Smoking status: Never Smoker  . Smokeless tobacco: Never Used  . Alcohol use No     Allergies   Shellfish allergy   Review of Systems Review of Systems  Constitutional: Negative for fever.  HENT: Positive for congestion, rhinorrhea and sore throat.   Respiratory: Positive for shortness of breath.     Gastrointestinal: Negative for abdominal pain, nausea and vomiting.  All other systems reviewed and are negative.    Physical Exam Updated Vital Signs BP 150/97 (BP Location: Right Arm)   Pulse 92   Temp 98.3 F (36.8 C) (Oral)   Resp 18   Wt 236 lb 14.4 oz (107.5 kg)   SpO2 96%   BMI 40.98 kg/m   Physical Exam Physical Exam  Nursing note and vitals reviewed. Constitutional: Well developed, well nourished, non-toxic, and in no acute distress Head: Normocephalic and atraumatic.  Mouth/Throat: Oropharynx is clear and moist.  Neck: Normal range of motion. Neck supple.  Cardiovascular: Normal rate and regular rhythm.   Pulmonary/Chest: Effort normal and breath sounds normal.  Abdominal: Soft. There is no tenderness. There is no rebound and no guarding.  Musculoskeletal: Normal range of motion.  Neurological: Alert, no facial droop, fluent speech, moves all extremities symmetrically Skin: Skin is warm and dry.  Psychiatric: Cooperative   ED Treatments / Results  DIAGNOSTIC STUDIES:  Oxygen Saturation is 96% on RA, normal by my interpretation.    COORDINATION OF CARE:  11:11 PM Discussed treatment plan with pt at bedside and pt agreed to plan.   Labs (all labs ordered are listed, but only abnormal results are displayed) Labs Reviewed - No data to display  EKG  EKG Interpretation None       Radiology No results found.  Procedures Procedures (including critical care time)  Medications Ordered in ED Medications - No data to display   Initial Impression / Assessment and Plan / ED Course  I have reviewed the triage vital signs and the nursing notes.  Pertinent labs & imaging results that were available during my care of the patient were reviewed by me and considered in my medical decision making (see chart for details).  Clinical Course    Presenting with few days of runny nose, nasal congestion, sore throat. She is nontoxic in no acute distress with  stable vital signs. Her lungs are clear, she is breathing comfortably. With obvious nasal congestion and suspect potential viral upper respiratory process. She is adamant that she does not think that this is an upper respiratory infection and thinks this may be more allergy related. No other signs of serious allergic reaction. No significant evidence of asthma exacerbation currently. Prescribed Zyrtec and decongestants for supportive care. Strict return and follow-up instructions reviewed. She expressed understanding of all discharge instructions and felt comfortable with the plan of care.   Final Clinical Impressions(s) / ED Diagnoses   Final diagnoses:  Nasal congestion    New Prescriptions New Prescriptions  CETIRIZINE (ZYRTEC ALLERGY) 10 MG TABLET    Take 1 tablet (10 mg total) by mouth daily.   PSEUDOEPHEDRINE (SUDAFED) 60 MG TABLET    Take 1 tablet (60 mg total) by mouth every 4 (four) hours as needed for congestion.   I personally performed the services described in this documentation, which was scribed in my presence. The recorded information has been reviewed and is accurate.    Forde Dandy, MD 01/01/16 [phone]

## 2016-01-02 MED FILL — NASAL DECONGESTANT 0.05% SP: 0.05 | 30 days supply | Qty: 15 | Fill #0

## 2016-01-02 MED FILL — ALL DAY ALLERGY 10 MG TAB: 10 | 100 days supply | Qty: 100 | Fill #0

## 2016-04-02 ENCOUNTER — Other Ambulatory Visit: Payer: Self-pay | Admitting: Obstetrics and Gynecology

## 2016-04-02 DIAGNOSIS — Z1231 Encounter for screening mammogram for malignant neoplasm of breast: Secondary | ICD-10-CM

## 2016-04-22 ENCOUNTER — Inpatient Hospital Stay: Admission: RE | Admit: 2016-04-22 | Payer: BLUE CROSS/BLUE SHIELD | Source: Ambulatory Visit

## 2016-11-12 ENCOUNTER — Encounter: Payer: Self-pay | Admitting: Allergy and Immunology

## 2016-11-12 ENCOUNTER — Ambulatory Visit (INDEPENDENT_AMBULATORY_CARE_PROVIDER_SITE_OTHER): Payer: BLUE CROSS/BLUE SHIELD | Admitting: Allergy and Immunology

## 2016-11-12 VITALS — BP 132/70 | HR 74 | Temp 98.3°F | Resp 19 | Ht 62.0 in | Wt 239.4 lb

## 2016-11-12 DIAGNOSIS — J3089 Other allergic rhinitis: Secondary | ICD-10-CM

## 2016-11-12 DIAGNOSIS — R011 Cardiac murmur, unspecified: Secondary | ICD-10-CM | POA: Diagnosis not present

## 2016-11-12 DIAGNOSIS — J4530 Mild persistent asthma, uncomplicated: Secondary | ICD-10-CM | POA: Diagnosis not present

## 2016-11-12 DIAGNOSIS — T7800XD Anaphylactic reaction due to unspecified food, subsequent encounter: Secondary | ICD-10-CM | POA: Diagnosis not present

## 2016-11-12 MED ORDER — EPINEPHRINE 0.3 MG/0.3ML IJ SOAJ: 0.3000 mg | Freq: Once | INTRAMUSCULAR | 2 refills | Status: AC

## 2016-11-12 MED ORDER — ALBUTEROL SULFATE 108 (90 BASE) MCG/ACT IN AEPB: 2.0000 | INHALATION_SPRAY | RESPIRATORY_TRACT | 1 refills | Status: DC | PRN

## 2016-11-12 NOTE — Patient Instructions (Addendum)
  1. Obtain Shellfish IgE panel   2. Obtain 2D Echo w/doppler - systolic murmur  3. Nasal saline, OTC antihistamine, OTC ibuprofen, and OTC mucinex DM  4. Proair respiclick or similar two pufs every 4-6 hours if needed  5. AUVI-Q 0.3, Benadryl, MD/ER evaluation for allergic reaction  6. Obtain fall flu vaccine  7. Return in one year or earlier if there is a problem.

## 2016-11-12 NOTE — Progress Notes (Signed)
Follow-up Note  Referring Provider: No ref. provider found Primary Provider: Lajean Manes, MD Date of Office Visit: 11/12/2016  Subjective:   Barbara Guerrero (DOB: 10/14/1955) is a 61 y.o. female who returns to the [facility] on 11/12/2016 in re-evaluation of the following:  HPI: Toyia returns to this clinic in reevaluation of her intermittent asthma and allergic rhinitis and history of food allergy directed against shellfish. I have not seen her in this clinic in years.  She states that she is really doing quite well with her asthma and rarely uses a short acting bronchodilator and has not required a systemic steroid to treat an exacerbation. Likewise, her upper airways are really doing quite well and it does not sound as though she has required an antibiotic to treat an episode of sinusitis.  She is interested in working through the issue of her shellfish allergies to see if she qualifies for an in clinic food challenge.  Allergies as of 11/12/2016      Reactions   Shellfish Allergy Itching      Medication List      acetaminophen 500 MG tablet Commonly known as:  TYLENOL Take 1,000 mg by mouth every 6 (six) hours as needed for headache.   albuterol 108 (90 Base) MCG/ACT inhaler Commonly known as:  PROVENTIL HFA;VENTOLIN HFA Inhale 2 puffs into the lungs every 6 (six) hours as needed for wheezing or shortness of breath.   EPINEPHrine 0.3 mg/0.3 mL Soaj injection Commonly known as:  EPIPEN 2-PAK Inject 0.3 mLs (0.3 mg total) into the muscle once.   etodolac 400 MG tablet Commonly known as:  LODINE Take 400 mg by mouth as needed.   predniSONE 10 MG (48) Tbpk tablet Commonly known as:  STERAPRED UNI-PAK 48 TAB   VITAMIN B 12 PO Take 1 tablet by mouth daily.       Past Medical History:  Diagnosis Date  . Amenorrhea   . Asthma   . Barium enema abnormal   . Breast cyst   . Family history of breast cancer in first degree relative   .  Fibroids   . Lactose intolerance   . Mastodynia   . Pelvic pain in female   . Rectal fissure     Past Surgical History:  Procedure Laterality Date  . BRAIN SURGERY  2003  . BREAST SURGERY  1977  . CESAREAN SECTION  1980/1985  . DIAGNOSTIC LAPAROSCOPY      Review of systems negative except as noted in HPI / PMHx or noted below:  Review of Systems  Constitutional: Negative.   HENT: Negative.   Eyes: Negative.   Respiratory: Negative.   Cardiovascular: Negative.   Gastrointestinal: Negative.   Genitourinary: Negative.   Musculoskeletal: Negative.   Skin: Negative.   Neurological: Negative.   Endo/Heme/Allergies: Negative.   Psychiatric/Behavioral: Negative.      Objective:   Vitals:   11/12/16 0957  BP: 132/70  Pulse: 74  Resp: 19  Temp: 98.3 F (36.8 C)   Height: 5\' 2"  (157.5 cm)  Weight: 239 lb 6.4 oz (108.6 kg)   Physical Exam  Constitutional: She is well-developed, well-nourished, and in no distress.  HENT:  Head: Normocephalic.  Right Ear: Tympanic membrane, external ear and ear canal normal.  Left Ear: Tympanic membrane, external ear and ear canal normal.  Nose: Nose normal. No mucosal edema or rhinorrhea.  Mouth/Throat: Uvula is midline, oropharynx is clear and moist and mucous membranes are normal. No  oropharyngeal exudate.  Eyes: Conjunctivae are normal.  Neck: Trachea normal. No tracheal tenderness present. No tracheal deviation present. No thyromegaly present.  Cardiovascular: Normal rate, regular rhythm, S1 normal and S2 normal.   Murmur (systolic murmur) heard. Pulmonary/Chest: Breath sounds normal. No stridor. No respiratory distress. She has no wheezes. She has no rales.  Musculoskeletal: She exhibits no edema.  Lymphadenopathy:       Head (right side): No tonsillar adenopathy present.       Head (left side): No tonsillar adenopathy present.    She has no cervical adenopathy.  Neurological: She is alert. Gait normal.  Skin: No rash noted.  She is not diaphoretic. No erythema. Nails show no clubbing.  Psychiatric: Mood and affect normal.    Diagnostics:    Spirometry was performed and demonstrated an FEV1 of 1.73 at 90 % of predicted.  The patient had an Asthma Control Test with the following results: ACT Total Score: 25.    Assessment and Plan:   1. Well controlled mild persistent asthma   2. Allergy with anaphylaxis due to food, subsequent encounter   3. Other allergic rhinitis   4. Murmur     1. Obtain Shellfish IgE panel   2. Obtain 2D Echo w/doppler for systolic murmur  3. Nasal saline, OTC antihistamine, OTC ibuprofen, and OTC mucinex DM  4. Proair respiclick or similar two pufs every 4-6 hours if needed  5. AUVI-Q 0.3, Benadryl, MD/ER evaluation for allergic reaction  6. Obtain fall flu vaccine  7. Return in one year or earlier if there is a problem.  Jamonica appears to be doing relatively well regarding her respiratory tract issue. We will work through her shellfish allergy by checking antigen specific IgE antibodies against food products to see if she qualifies for an in clinic food challenge. She has a murmur that I do not have documented in her clinic notes several years ago and we will further assess that issue with a ECHO. I will contact her with the results of her blood tests and ECHO once they are available for review.  Allena Katz, MD Allergy / Immunology [place]

## 2016-11-13 ENCOUNTER — Telehealth: Payer: Self-pay | Admitting: Allergy and Immunology

## 2016-11-13 NOTE — Telephone Encounter (Signed)
Late entry for 11/12/16, called and left message for patient to contact the office to inform her that her 2D Echocardiogram was ordered and she should receive a phone call from [facility] within 72 hours and if she doesn't receive a phone call to give [place] a call back so we can get it scheduled.

## 2016-11-14 LAB — ALLERGEN PROFILE, SHELLFISH
Clam IgE: <0.1 kU/L
F023-IgE Crab: <0.1 kU/L
F290-IgE Oyster: <0.1 kU/L
SHRIMP IGE: 0.14 kU/L — AB

## 2016-11-22 ENCOUNTER — Encounter: Payer: Self-pay | Admitting: Allergy and Immunology

## 2016-12-04 ENCOUNTER — Ambulatory Visit (INDEPENDENT_AMBULATORY_CARE_PROVIDER_SITE_OTHER): Payer: BLUE CROSS/BLUE SHIELD | Admitting: Urgent Care

## 2016-12-04 ENCOUNTER — Encounter: Payer: Self-pay | Admitting: Urgent Care

## 2016-12-04 VITALS — BP 121/77 | HR 80 | Temp 98.5°F | Resp 16 | Ht 62.0 in | Wt 240.6 lb

## 2016-12-04 DIAGNOSIS — L989 Disorder of the skin and subcutaneous tissue, unspecified: Secondary | ICD-10-CM | POA: Diagnosis not present

## 2016-12-04 DIAGNOSIS — L299 Pruritus, unspecified: Secondary | ICD-10-CM

## 2016-12-04 DIAGNOSIS — R58 Hemorrhage, not elsewhere classified: Secondary | ICD-10-CM | POA: Diagnosis not present

## 2016-12-04 MED ORDER — CEPHALEXIN 500 MG PO CAPS: 500.0000 mg | ORAL_CAPSULE | Freq: Three times a day (TID) | ORAL | 0 refills | Status: DC

## 2016-12-04 NOTE — Progress Notes (Signed)
Patient does not want to be seen by a physician assistant. I let patient know that I could help her with her possible insect bite but she insisted on seeing a physician. I let our staff know that she is requesting this and will try to see if there is an appointment available for her.

## 2016-12-04 NOTE — Patient Instructions (Addendum)
Please use Alleve as directed by the bottle's instructions. Apply warm compresses for 15-20 minutes 2-3 times daily. If your wound becomes more painful, red, swollen or your develop fever then please start the antibiotic as directed on your script. Otherwise, come back to the clinic if your symptoms persist despite these measures. Thank you for letting me participate in your health and well being.    IF you received an x-ray today, you will receive an invoice from [facility]. Please contact [facility] at [phone] with questions or concerns regarding your invoice.   IF you received labwork today, you will receive an invoice from [facility]. Please contact [facility] at [phone] with questions or concerns regarding your invoice.   Our billing staff will not be able to assist you with questions regarding bills from these companies.  You will be contacted with the lab results as soon as they are available. The fastest way to get your results is to activate your My Chart account. Instructions are located on the last page of this paperwork. If you have not heard from [place] regarding the results in 2 weeks, please contact this office.

## 2016-12-04 NOTE — Progress Notes (Deleted)
    MRN: [id number] DOB: Dec 09, 1955  Subjective:   Barbara Guerrero is a 61 y.o. female presenting for chief complaint of Insect Bite (possible bug bite unsure what, started as a little raised mole on Saturday, itchy, monday noticed more itchiness and turned black around site, tuesday site getting bigger red/black.  Per pt using ice on it.)     Serenah has a current medication list which includes the following prescription(s): acetaminophen, albuterol sulfate, cyanocobalamin, etodolac, and prednisone. Also is allergic to shellfish allergy.  Tawna  has a past medical history of Amenorrhea; Asthma; Barium enema abnormal; Breast cyst; Family history of breast cancer in first degree relative; Fibroids; Lactose intolerance; Mastodynia; Pelvic pain in female; and Rectal fissure. Also  has a past surgical history that includes Brain surgery (2003); Cesarean section (1980/1985); Breast surgery (1977); and Diagnostic laparoscopy.  Objective:   Vitals: BP 121/77 (BP Location: Right Arm, Patient Position: Sitting, Cuff Size: Large)   Pulse 80   Temp 98.5 F (36.9 C) (Oral)   Resp 16   Ht 5\' 2"  (1.575 m)   Wt 240 lb 9.6 oz (109.1 kg)   SpO2 94%   BMI 44.01 kg/m   Physical Exam  No results found for this or any previous visit (from the past 24 hour(s)).  Assessment and Plan :     Jaynee Eagles, PA-C [facility] at [place] [phone] 12/04/2016  10:22 AM

## 2016-12-04 NOTE — Progress Notes (Signed)
  MRN: [id number] DOB: 1956-02-14  Subjective:   Barbara Guerrero is a 61 y.o. female presenting for chief complaint of Insect Bite  Reports noticing swelling, itching of her left upper arm ~5 days ago. This progressed to redness, tenderness, bruising. She has tried icing without any significant changes. Denies fever, drainage of pus or bleeding, insect bite/wound, trauma, tick bite. Denies smoking cigarettes or drinking alcohol.   Barbara Guerrero has a current medication list which includes the following prescription(s): acetaminophen, albuterol sulfate, cyanocobalamin, etodolac, and prednisone. Also is allergic to shellfish allergy.  Barbara Guerrero  has a past medical history of Amenorrhea; Asthma; Barium enema abnormal; Breast cyst; Family history of breast cancer in first degree relative; Fibroids; Lactose intolerance; Mastodynia; Pelvic pain in female; and Rectal fissure. Also  has a past surgical history that includes Brain surgery (2003); Cesarean section (1980/1985); Breast surgery (1977); and Diagnostic laparoscopy.  Objective:   Vitals: BP 121/77 (BP Location: Right Arm, Patient Position: Sitting, Cuff Size: Large)   Pulse 80   Temp 98.5 F (36.9 C) (Oral)   Resp 16   Ht 5\' 2"  (1.575 m)   Wt 240 lb 9.6 oz (109.1 kg)   SpO2 94%   BMI 44.01 kg/m   Physical Exam  Constitutional: She is oriented to person, place, and time. She appears well-developed and well-nourished.  Cardiovascular: Normal rate.   Pulmonary/Chest: Effort normal.  Musculoskeletal:       Left elbow: She exhibits normal range of motion, no swelling, no effusion, no deformity and no laceration. Tenderness (over medial aspect of flexor surface of elbow/distal-medial portion of upper left arm) found.       Arms: Neurological: She is alert and oriented to person, place, and time.   Assessment and Plan :   1. Skin lesion of left arm 2. Itching 3. Ecchymosis - Patient declined blood work. Overall, physical exam findings  reassuring. I suspect that she has some reactive lymphadenopathy secondary to what may have been an insect bite. Patient will start warm compresses, Alleve. She will fill a script for Keflex if she develops signs and symptoms of infection. Return-to-clinic precautions discussed, patient verbalized understanding.   Barbara Eagles, PA-C Primary Care at [facility] [phone] 12/04/2016  10:57 AM

## 2017-02-04 ENCOUNTER — Ambulatory Visit (INDEPENDENT_AMBULATORY_CARE_PROVIDER_SITE_OTHER): Payer: BLUE CROSS/BLUE SHIELD | Admitting: Family Medicine

## 2017-02-04 ENCOUNTER — Encounter: Payer: Self-pay | Admitting: Family Medicine

## 2017-02-04 VITALS — BP 136/92 | HR 86 | Temp 98.2°F | Resp 16 | Ht 62.0 in | Wt 233.0 lb

## 2017-02-04 DIAGNOSIS — Z9109 Other allergy status, other than to drugs and biological substances: Secondary | ICD-10-CM | POA: Diagnosis not present

## 2017-02-04 DIAGNOSIS — J45909 Unspecified asthma, uncomplicated: Secondary | ICD-10-CM

## 2017-02-04 DIAGNOSIS — R0981 Nasal congestion: Secondary | ICD-10-CM

## 2017-02-04 MED ORDER — ALBUTEROL SULFATE HFA 108 (90 BASE) MCG/ACT IN AERS: 2.0000 | INHALATION_SPRAY | Freq: Four times a day (QID) | RESPIRATORY_TRACT | 2 refills | Status: DC | PRN

## 2017-02-04 MED ORDER — FLUTICASONE PROPIONATE 50 MCG/ACT NA SUSP: 2.0000 | Freq: Every day | NASAL | 2 refills | Status: AC

## 2017-02-04 MED ORDER — CETIRIZINE HCL 10 MG PO TABS: 10.0000 mg | ORAL_TABLET | Freq: Every day | ORAL | 2 refills | Status: DC

## 2017-02-04 NOTE — Progress Notes (Signed)
10/23/20186:10 PM  Barbara Guerrero [birth year], 61 y.o. female [id number]  Chief Complaint  Patient presents with  . URI    runny nose, itchy chest, body aches, vomited before she came, all this started this morning    HPI:   Patient is a 61 y.o. female who presents today for acute onset of nasal congestion, clear rhinorrhea, body aches, vomiting x 1, malaise that started after exposure to a specific smell. Endorses cough with tightness and hoarseness. Denies SOB, swelling of throat, trouble swallowing. Has not taken anything for it today.  Depression screen [facility] 2/9 02/04/2017 12/04/2016  Decreased Interest 0 0  Down, Depressed, Hopeless 0 0  PHQ - 2 Score 0 0    Allergies  Allergen Reactions  . Shellfish Allergy Itching    Prior to Admission medications   Medication Sig Start Date End Date Taking? Authorizing Provider  EPINEPHrine (EPIPEN 2-PAK) 0.3 mg/0.3 mL IJ SOAJ injection EpiPen 2-Pak 0.3 mg/0.3 mL injection, auto-injector   Yes [provider]  albuterol (PROVENTIL HFA;VENTOLIN HFA) 108 (90 Base) MCG/ACT inhaler Inhale 2 puffs into the lungs every 6 (six) hours as needed for wheezing or shortness of breath. 02/04/17   Rutherford Guys, MD  cetirizine (ZYRTEC) 10 MG tablet Take 1 tablet (10 mg total) by mouth daily. 02/04/17   Rutherford Guys, MD  Cyanocobalamin (VITAMIN B 12 PO) Take 1 tablet by mouth daily.    [provider]  fluticasone (FLONASE) 50 MCG/ACT nasal spray Place 2 sprays into both nostrils daily. 02/04/17   Rutherford Guys, MD    Past Medical History:  Diagnosis Date  . Amenorrhea   . Asthma   . Barium enema abnormal   . Breast cyst   . Family history of breast cancer in first degree relative   . Fibroids   . Lactose intolerance   . Mastodynia   . Pelvic pain in female   . Rectal fissure     Past Surgical History:  Procedure Laterality Date  . BRAIN SURGERY  2003  . BREAST SURGERY  1977  . CESAREAN SECTION  1980/1985    . DIAGNOSTIC LAPAROSCOPY      Social History  Substance Use Topics  . Smoking status: Never Smoker  . Smokeless tobacco: Never Used  . Alcohol use No    Family History  Problem Relation Age of Onset  . Hypertension Mother   . Cancer Sister        breast cancer    ROS Per HPI  OBJECTIVE:  Blood pressure (!) 136/92, pulse 86, temperature 98.2 F (36.8 C), resp. rate 16, height 5\' 2"  (1.575 m), weight 233 lb (105.7 kg), SpO2 96 %.  Physical Exam  Constitutional: She is oriented to person, place, and time and well-developed, well-nourished, and in no distress.  HENT:  Head: Normocephalic and atraumatic.  Right Ear: Hearing, tympanic membrane, external ear and ear canal normal.  Left Ear: Hearing, tympanic membrane, external ear and ear canal normal.  Mouth/Throat: Oropharynx is clear and moist.  Eyes: Pupils are equal, round, and reactive to light. EOM are normal.  Neck: Neck supple.  Cardiovascular: Normal rate, regular rhythm and normal heart sounds.  Exam reveals no gallop and no friction rub.   No murmur heard. Pulmonary/Chest: Effort normal and breath sounds normal. She has no wheezes. She has no rales.  Lymphadenopathy:    She has no cervical adenopathy.  Neurological: She is alert and oriented to person, place, and time. Gait  normal.  Skin: Skin is warm and dry.    ASSESSMENT and PLAN  1. Asthma due to environmental allergies Patient wo signs of distress or wheezing in clinic but with symptoms that suggest mild asthma response, rx as below. RTC precautions discussed.   2. Nasal congestion  3. Environmental allergies  Other orders - EPINEPHrine (EPIPEN 2-PAK) 0.3 mg/0.3 mL IJ SOAJ injection; EpiPen 2-Pak 0.3 mg/0.3 mL injection, auto-injector - albuterol (PROVENTIL HFA;VENTOLIN HFA) 108 (90 Base) MCG/ACT inhaler; Inhale 2 puffs into the lungs every 6 (six) hours as needed for wheezing or shortness of breath. - cetirizine (ZYRTEC) 10 MG tablet; Take 1 tablet  (10 mg total) by mouth daily. - fluticasone (FLONASE) 50 MCG/ACT nasal spray; Place 2 sprays into both nostrils daily.  Return if symptoms worsen or fail to improve.    Rutherford Guys, MD Primary Care at [place] [place], [place] [address] Ph.  [phone] Fax [phone]

## 2017-02-04 NOTE — Patient Instructions (Signed)
     IF you received an x-ray today, you will receive an invoice from [facility]. Please contact [facility] at [phone] with questions or concerns regarding your invoice.   IF you received labwork today, you will receive an invoice from [facility]. Please contact [facility] at [phone] with questions or concerns regarding your invoice.   Our billing staff will not be able to assist you with questions regarding bills from these companies.  You will be contacted with the lab results as soon as they are available. The fastest way to get your results is to activate your My Chart account. Instructions are located on the last page of this paperwork. If you have not heard from us regarding the results in 2 weeks, please contact this office.     

## 2017-02-12 ENCOUNTER — Ambulatory Visit: Payer: BLUE CROSS/BLUE SHIELD | Admitting: Allergy and Immunology

## 2017-04-02 ENCOUNTER — Other Ambulatory Visit: Payer: Self-pay | Admitting: Geriatric Medicine

## 2017-04-02 DIAGNOSIS — R1310 Dysphagia, unspecified: Secondary | ICD-10-CM

## 2017-04-09 ENCOUNTER — Ambulatory Visit
Admission: RE | Admit: 2017-04-09 | Discharge: 2017-04-09 | Disposition: A | Discharge status: Home / Self Care | Payer: BLUE CROSS/BLUE SHIELD | Source: Ambulatory Visit | Attending: Geriatric Medicine | Admitting: Geriatric Medicine

## 2017-04-09 DIAGNOSIS — R1310 Dysphagia, unspecified: Secondary | ICD-10-CM

## 2017-04-16 ENCOUNTER — Encounter: Payer: Self-pay | Admitting: Family Medicine

## 2017-04-16 ENCOUNTER — Ambulatory Visit: Payer: BLUE CROSS/BLUE SHIELD | Admitting: Family Medicine

## 2017-04-16 VITALS — BP 160/104 | HR 90 | Ht 62.0 in | Wt 235.2 lb

## 2017-04-16 DIAGNOSIS — T7800XD Anaphylactic reaction due to unspecified food, subsequent encounter: Secondary | ICD-10-CM | POA: Diagnosis not present

## 2017-04-16 DIAGNOSIS — J3089 Other allergic rhinitis: Secondary | ICD-10-CM | POA: Diagnosis not present

## 2017-04-16 DIAGNOSIS — J4520 Mild intermittent asthma, uncomplicated: Secondary | ICD-10-CM

## 2017-04-16 DIAGNOSIS — J45909 Unspecified asthma, uncomplicated: Secondary | ICD-10-CM | POA: Insufficient documentation

## 2017-04-16 NOTE — Patient Instructions (Addendum)
   1. Avoid water damage, mildew, and Lysol  2. Begin Zyrtec 10 mg daily as needed for runny nose  3. Proventil HFA (albuterol inhaler) two pufs every 4-6 hours if needed  4. Update current EpiPen 0.3 mg   5. Begin using Dymista 1 spray in each nostril twice a day. Do not use Flonase while using Dymista.   6. Return in 6 months or earlier if there is a problem.

## 2017-04-16 NOTE — Progress Notes (Signed)
[place]  [address] Dept: [phone]  FAMILY NURSE PRACTITIONER FOLLOW UP NOTE  Patient ID: Barbara Guerrero, female    DOB: 1956/03/15  Age: 62 y.o. MRN: [id number] Date of Office Visit: 04/16/2017  Assessment  Chief Complaint: Follow-up (Pt presents for asthma follow up of asthma. Pt was exposed to molds over the holidays.)  HPI Barbara Guerrero is a 62 year old female who presents to the clinic today for a sick visit. She was last seen in this clinic on 10/15/2016 by Dr. Neldon Mc for evaluation of asthma, allergic rhinitis, and food allergy. At that time, her asthma and allergic rhinitis was reported as well controlled.   At today's visit, she reports that in late December she was exposed to water that had leaked from a hot water heater and saturated the carpet. It is reported that a non-professional carpet cleaner came to the house and cleaned the carpet with an old-fashioned Lysol solution in a dark brown bottle. She noted that, on December 26, she began to have a sore throat, a cough that stuck in her throat, laryngitis, increased nasal drainage, and increased throat clearing. She reports that she has been using her Flonase on a regular basis and has not been taking an antihistamine.  She denies shortness of breath, wheezing, nighttime awakening due to asthma symptoms and limitation of activity due to asthma symptoms. She uses her Proventil inhaler infrequently.   She reports continued avoidance as seafood. She does report she has an EpiPen that is out of date. She has not needed to use an epinephrine device for her seafood allergy.  Barbara Guerrero was noted to be hypertensive today on both admission and discharge blood pressure readings. She will follow this with her primary care provider.  She reports she has not had the echocardiogram reading that was ordered at her last visit with Dr. Neldon Mc because she was unsure about how to proceed with the procedure.  She is  interested in incorporating seafood back in her diet. After reviewing her shellfish panel IgE results, she reported that she does not like shrimp which was the only elevated level. She was unsure of which seafood caused her initial reaction of hives. I suggested she bring a seafood that she would like to incorporate back and her diet into the office for a food challenge.   Drug Allergies:  Allergies  Allergen Reactions  . Shellfish Allergy Itching    Physical Exam: BP (!) 160/104 (BP Location: Right Arm, Patient Position: Sitting, Cuff Size: Large)   Pulse 90   Ht 5\' 2"  (1.575 m)   Wt 235 lb 3.2 oz (106.7 kg)   SpO2 97%   BMI 43.02 kg/m    Physical Exam  Constitutional: She is oriented to person, place, and time. She appears well-developed and well-nourished.  HENT:  Right Ear: External ear normal.  Left Ear: External ear normal.  Bilateral nares erythematous and edematous with clear drainage from both. Pharynx erythematous and edematous. Tonsils unremarkable with no exudate noted.Ears normal. Eyes normal.  Eyes: Conjunctivae are normal.  Neck: Normal range of motion. Neck supple.  Cardiovascular: Normal rate and regular rhythm.  S1-S2 noted. Regular heart rate and rhythm. Systolic murmur noted  Pulmonary/Chest: Effort normal and breath sounds normal.  Lungs clear to auscultation  Musculoskeletal: Normal range of motion.  Neurological: She is alert and oriented to person, place, and time.  Skin: Skin is warm and dry.    Diagnostics: FVC 2.29, FEV1 1.78. Predicted  FVC 2.5 to predicted FEV1 2.01. Spirometry is within the normal range.    Assessment and Plan: 1. Other allergic rhinitis   2. Allergy with anaphylaxis due to food, subsequent encounter   3. Asthma, mild intermittent, well-controlled     1. Avoid water damage, mildew, and Lysol cleaning solution  2. Begin Zyrtec 10 mg daily as needed for runny nose (sample provided)  3. Proventil HFA (albuterol inhaler) two  pufs every 4-6 hours if needed  4. Update current EpiPen 0.3 mg   5. Begin using Dymista 1 spray in each nostril twice a day (sample provided). Do not use Flonase while using Dymista.   6. Return in 6 months or earlier if there is a problem.  Return in about 6 months (around 10/14/2017), or if symptoms worsen or fail to improve.   It appears as though Barbara Guerrero is experiencing some upper airway irritation leading to increased post nasal drip. I have provided her with some samples of medications in the office today. She was noted to be hypertensive upon both arrival and discharge. Additionally, she was noted to have a systolic heart murmur on exam today.  Thank you for the opportunity to care for this patient.  Please do not hesitate to contact me with questions.  Gareth Morgan, FNP [facility]  I have provided oversight concerning Gareth Morgan' evaluation and treatment of this patient's health issues addressed during today's encounter. I agree with the assessment and therapeutic plan as outlined in the note.   Signed,   Jiles Prows, MD,  Allergy and Immunology,  [facility].

## 2017-05-01 DIAGNOSIS — M545 Low back pain: Secondary | ICD-10-CM | POA: Diagnosis not present

## 2017-05-15 DIAGNOSIS — I10 Essential (primary) hypertension: Secondary | ICD-10-CM | POA: Diagnosis not present

## 2017-05-15 DIAGNOSIS — K224 Dyskinesia of esophagus: Secondary | ICD-10-CM | POA: Diagnosis not present

## 2017-06-12 ENCOUNTER — Other Ambulatory Visit: Payer: Self-pay | Admitting: Obstetrics and Gynecology

## 2017-06-12 DIAGNOSIS — Z1239 Encounter for other screening for malignant neoplasm of breast: Secondary | ICD-10-CM

## 2017-06-18 DIAGNOSIS — I10 Essential (primary) hypertension: Secondary | ICD-10-CM | POA: Diagnosis not present

## 2017-06-27 ENCOUNTER — Ambulatory Visit: Payer: BLUE CROSS/BLUE SHIELD

## 2017-06-27 ENCOUNTER — Ambulatory Visit
Admission: RE | Admit: 2017-06-27 | Discharge: 2017-06-27 | Disposition: A | Discharge status: Home / Self Care | Payer: 59 | Source: Ambulatory Visit | Attending: Obstetrics and Gynecology | Admitting: Obstetrics and Gynecology

## 2017-06-27 DIAGNOSIS — Z1231 Encounter for screening mammogram for malignant neoplasm of breast: Secondary | ICD-10-CM | POA: Diagnosis not present

## 2017-06-27 DIAGNOSIS — Z1239 Encounter for other screening for malignant neoplasm of breast: Secondary | ICD-10-CM

## 2017-07-30 ENCOUNTER — Telehealth: Payer: Self-pay | Admitting: Allergy and Immunology

## 2017-07-30 ENCOUNTER — Other Ambulatory Visit: Payer: Self-pay

## 2017-07-30 MED ORDER — EPINEPHRINE 0.3 MG/0.3ML IJ SOAJ: INTRAMUSCULAR | 3 refills | Status: DC

## 2017-07-30 MED ORDER — ALBUTEROL SULFATE HFA 108 (90 BASE) MCG/ACT IN AERS: 2.0000 | INHALATION_SPRAY | Freq: Four times a day (QID) | RESPIRATORY_TRACT | 2 refills | Status: DC | PRN

## 2017-07-30 NOTE — Telephone Encounter (Signed)
Patient stopped in and is requesting a refill on Proventil and an Epi Pen. [facility] .

## 2017-07-30 NOTE — Telephone Encounter (Signed)
Refills sent to pharmacy. 

## 2017-08-11 DIAGNOSIS — I10 Essential (primary) hypertension: Secondary | ICD-10-CM | POA: Diagnosis not present

## 2017-08-11 DIAGNOSIS — M25562 Pain in left knee: Secondary | ICD-10-CM | POA: Diagnosis not present

## 2017-09-24 ENCOUNTER — Other Ambulatory Visit: Payer: Self-pay | Admitting: Obstetrics and Gynecology

## 2017-09-24 DIAGNOSIS — Z01419 Encounter for gynecological examination (general) (routine) without abnormal findings: Secondary | ICD-10-CM | POA: Diagnosis not present

## 2017-09-24 DIAGNOSIS — N644 Mastodynia: Secondary | ICD-10-CM | POA: Diagnosis not present

## 2017-09-26 ENCOUNTER — Ambulatory Visit
Admission: RE | Admit: 2017-09-26 | Discharge: 2017-09-26 | Disposition: A | Discharge status: Home / Self Care | Payer: 59 | Source: Ambulatory Visit | Attending: Obstetrics and Gynecology | Admitting: Obstetrics and Gynecology

## 2017-09-26 DIAGNOSIS — N644 Mastodynia: Secondary | ICD-10-CM

## 2017-09-26 DIAGNOSIS — R928 Other abnormal and inconclusive findings on diagnostic imaging of breast: Secondary | ICD-10-CM | POA: Diagnosis not present

## 2017-12-11 DIAGNOSIS — K625 Hemorrhage of anus and rectum: Secondary | ICD-10-CM | POA: Diagnosis not present

## 2017-12-11 DIAGNOSIS — K224 Dyskinesia of esophagus: Secondary | ICD-10-CM | POA: Diagnosis not present

## 2017-12-11 DIAGNOSIS — K6289 Other specified diseases of anus and rectum: Secondary | ICD-10-CM | POA: Diagnosis not present

## 2017-12-18 DIAGNOSIS — M170 Bilateral primary osteoarthritis of knee: Secondary | ICD-10-CM | POA: Diagnosis not present

## 2018-03-25 ENCOUNTER — Other Ambulatory Visit: Payer: Self-pay | Admitting: Gastroenterology

## 2018-03-25 DIAGNOSIS — R0789 Other chest pain: Secondary | ICD-10-CM

## 2018-03-30 ENCOUNTER — Other Ambulatory Visit: Payer: 59

## 2018-09-02 IMAGING — RF DG ESOPHAGUS
9 series · 14 of 24 positions shown · non-contrast
Comparison: 07/19/2009 esophagram.

CLINICAL DATA: 61-year-old female with intermittent sensation of
food sticking. Symptomatic feeling is in the mid back or lower
chest. Acknowledges specific food triggers for gastroesophageal
reflux symptoms, and she is oval weights those foods. Prior
esophagram an 3033.

EXAM:
ESOPHOGRAM / BARIUM SWALLOW / BARIUM TABLET STUDY
TECHNIQUE: Combined double contrast and single contrast examination performed
using effervescent crystals, thick barium liquid, and thin barium
liquid. The patient was observed with fluoroscopy swallowing a 13 mm
barium sulphate tablet.
FLUOROSCOPY TIME:  Fluoroscopy Time:  1 minutes 42 seconds
Radiation Exposure Index (if provided by the fluoroscopic device):
113 mGy
Number of Acquired Spot Images: 0

[Series 1: sequence · 2 of 12 frames shown (1 of 6)]
[frame 1/12]
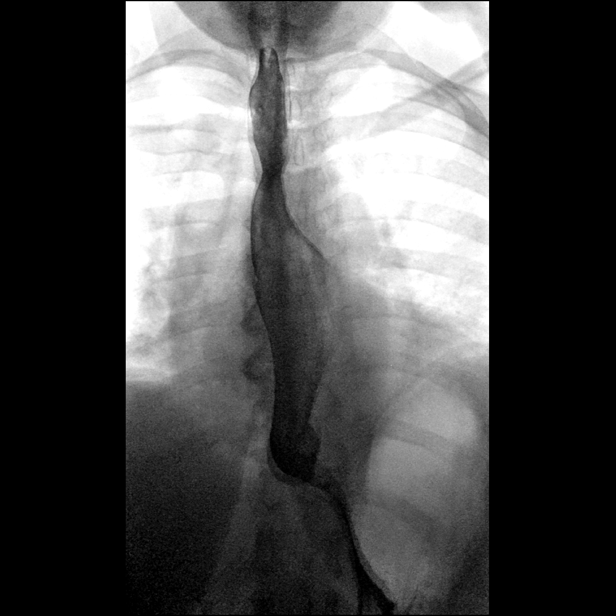
[frame 11/12]
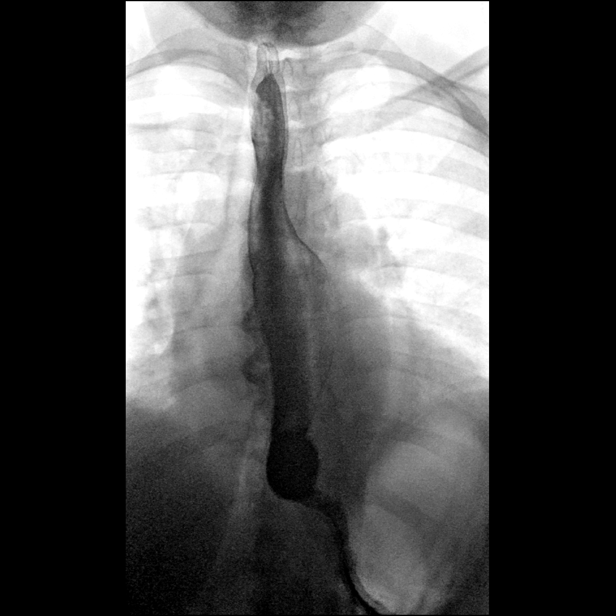

[Series 2: one shot · 1 of 3 slices shown (1 of 3)]
[im 3/3]
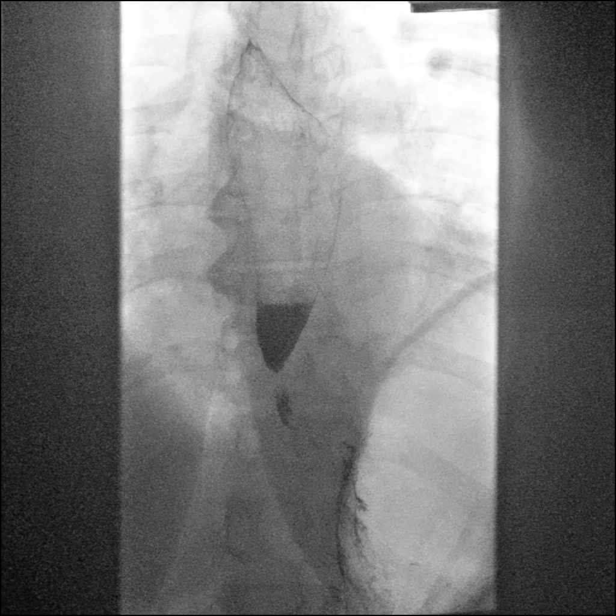

[Series 3: sequence · 2 of 9 frames shown (2 of 6)]
[frame 8/9]
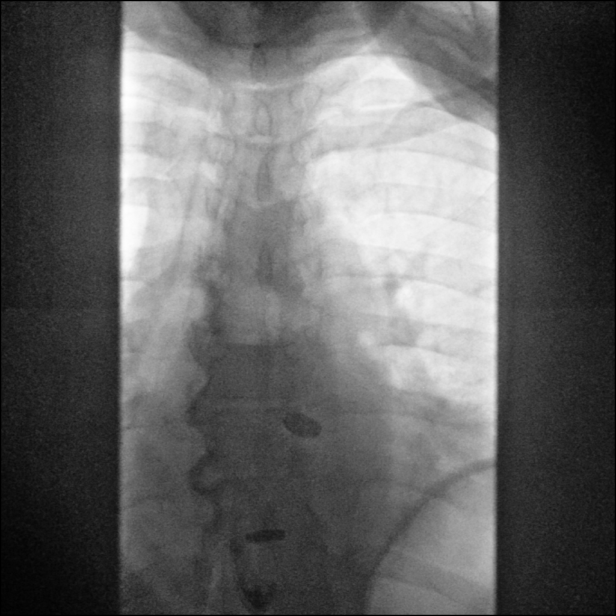
[frame 9/9]
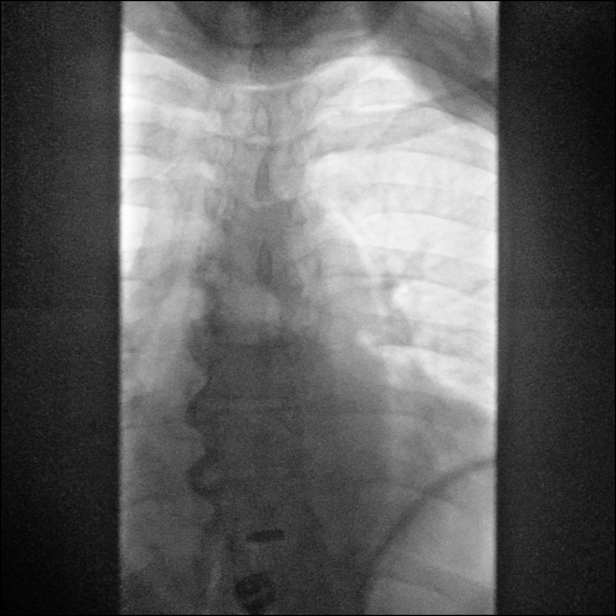

[Series 5: sequence · 1 of 9 frames shown (3 of 6)]
[frame 5/9]
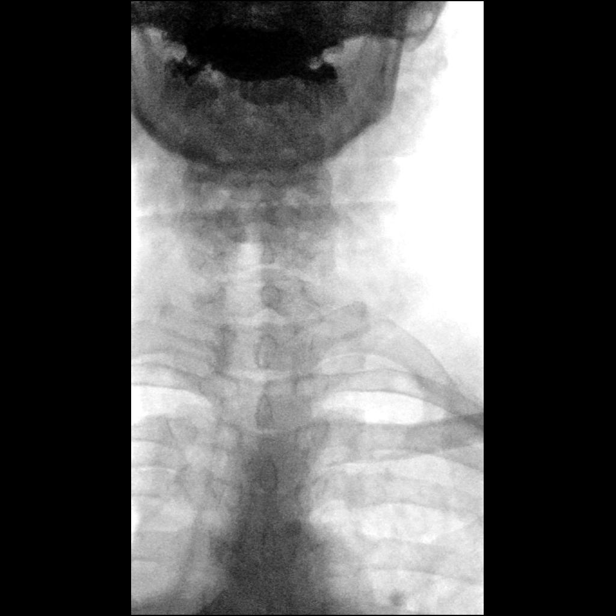

[Series 6: one shot · 2 of 3 slices shown (2 of 3)]
[im 1/3]
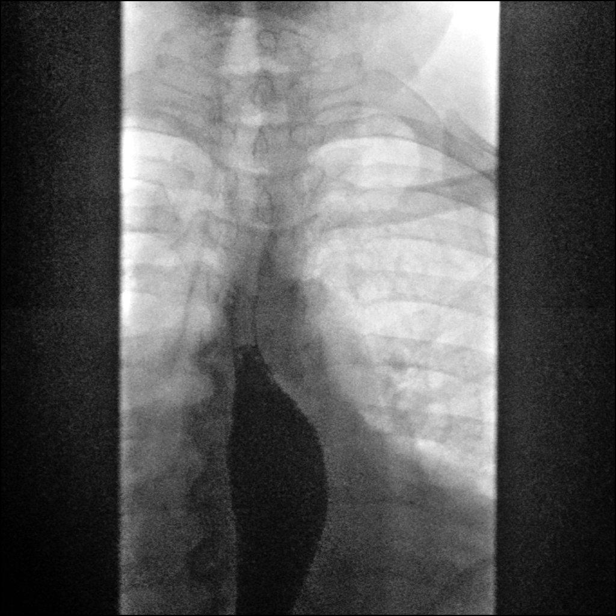
[im 3/3]
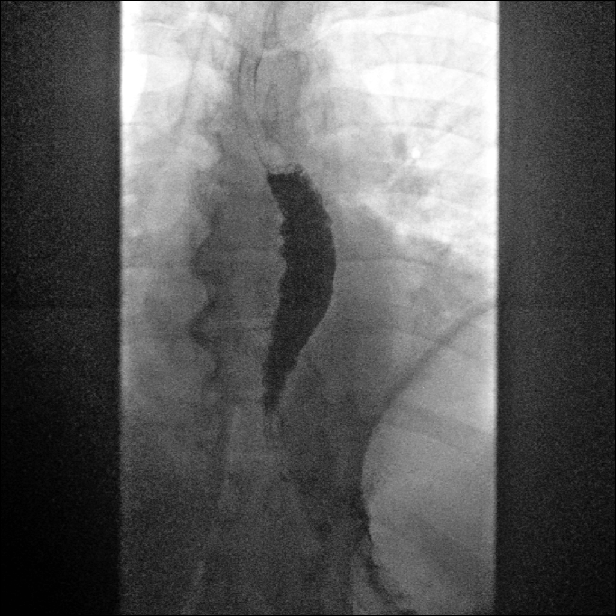

[Series 7: sequence · 1 of 20 frames shown (4 of 6)]
[frame 11/20]
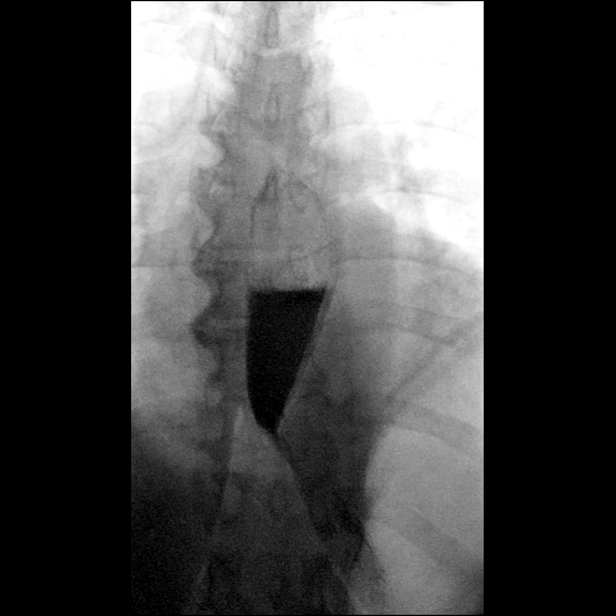

[Series 8: sequence · 1 of 8 frames shown (5 of 6)]
[frame 5/8]
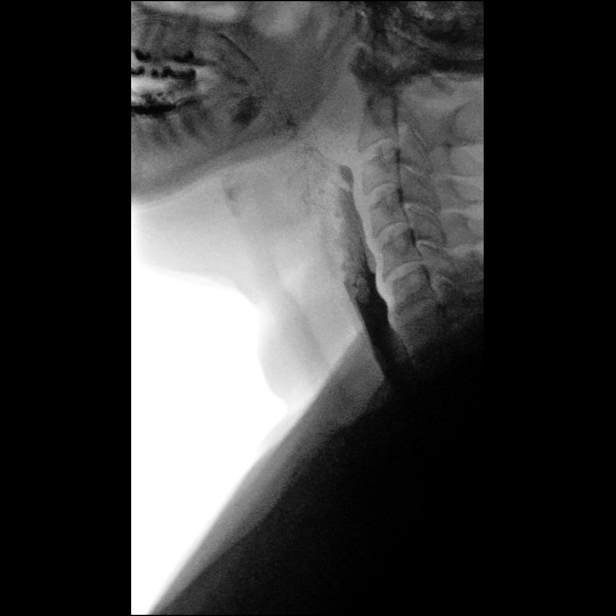

[Series 9: sequence · 2 of 27 frames shown (6 of 6)]
[frame 5/27]
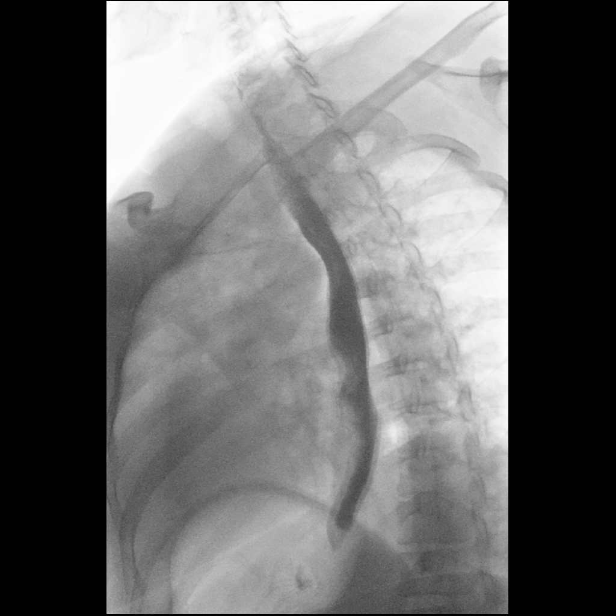
[frame 14/27]
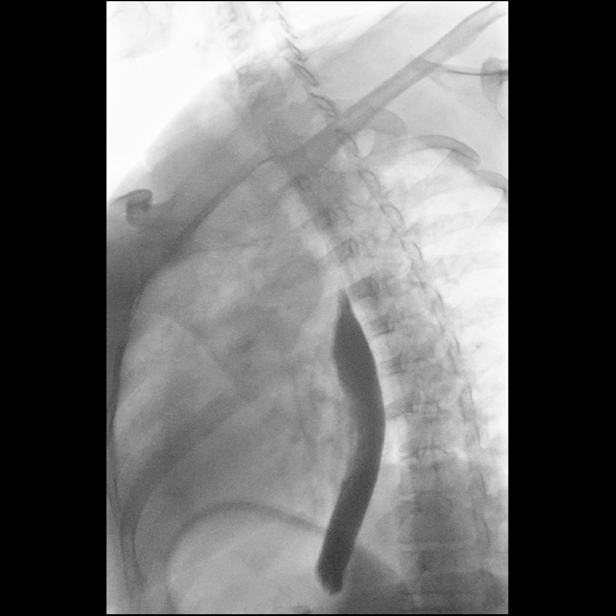

[Series 10: one shot · 2 of 4 slices shown (3 of 3)]
[im 1/4]
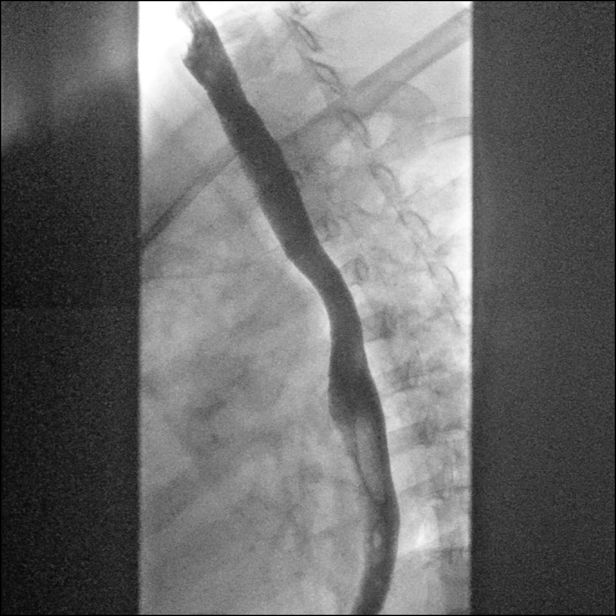
[im 4/4]
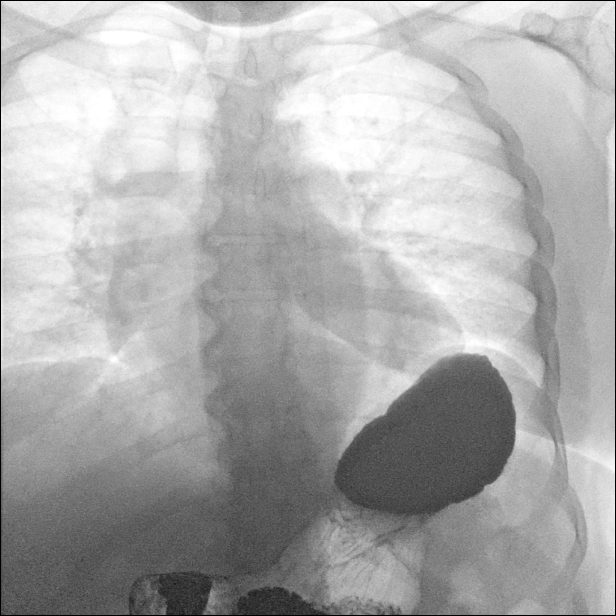

[14 of 24 positions shown; findings below may reference images not displayed]

FINDINGS: A double contrast study was undertaken and the patient tolerated
this well and without difficulty.

No obstruction to the forward flow of contrast throughout the
esophagus and into the stomach. Normal esophageal course. The
midthoracic esophagus is somewhat patulous (series 1, image 10).
Esophageal caliber and contour elsewhere appears normal. This
appearance of the thoracic esophagus is stable since 3033.

A 12.5 mm barium tablet was administered and passed freely to the
stomach without delay.

Occasional tertiary contractions are demonstrated in the distal
esophagus (series 6). With prone swallows esophageal motility is
normal.

Normal cervical esophagus.

At the distal esophagus and gastroesophageal junction there is
either a prominent phrenic ampulla or chronic small hiatal hernia
(e.g. Series 1, image 7). This is stable since 3033.

No gastroesophageal reflux occurred spontaneously or was elicited.
Prompt gastric emptying incidentally noted.
IMPRESSION: Esophagram largely unchanged since the prior in 3033.

Mild presbyesophagus in the form of occasional tertiary contractions
in the distal esophagus which might cause intermittent transient
slowing of transit through the distal esophagus.

Chronic prominent phrenic ampulla versus small hiatal hernia,
unchanged since 3033. No associated gastroesophageal reflux occurred
or was elicited.

## 2019-03-30 ENCOUNTER — Telehealth: Payer: Self-pay | Admitting: *Deleted

## 2019-03-30 NOTE — Telephone Encounter (Addendum)
Patient stopped by office requesting Proventil refill.  Patient was last seen 05/03/2017.  Informed patient of last office visit and was past due for follow up.  Patient stated she did make office visit because she was doing okay and did not need one.  Informed patient that legally we could not send in refill for medication without patient being seen on yearly basis and asthma patients are followed up every 6 months.  Offered patient appointment with Dr. Neldon Mc on January 26th and patient did not want appointment. Patient returned back to the office later in the afternoon and made appointment with [facility] for Dr. Neldon Mc.

## 2019-04-06 ENCOUNTER — Encounter: Payer: Self-pay | Admitting: Allergy and Immunology

## 2019-04-06 ENCOUNTER — Other Ambulatory Visit: Payer: Self-pay

## 2019-04-06 ENCOUNTER — Ambulatory Visit (INDEPENDENT_AMBULATORY_CARE_PROVIDER_SITE_OTHER): Payer: 59 | Admitting: Allergy and Immunology

## 2019-04-06 VITALS — BP 162/92 | HR 80 | Temp 98.1°F | Resp 18 | Ht 61.75 in | Wt 236.6 lb

## 2019-04-06 DIAGNOSIS — J4520 Mild intermittent asthma, uncomplicated: Secondary | ICD-10-CM

## 2019-04-06 DIAGNOSIS — T7800XD Anaphylactic reaction due to unspecified food, subsequent encounter: Secondary | ICD-10-CM

## 2019-04-06 DIAGNOSIS — J3089 Other allergic rhinitis: Secondary | ICD-10-CM

## 2019-04-06 MED ORDER — EPINEPHRINE 0.3 MG/0.3ML IJ SOAJ: INTRAMUSCULAR | 3 refills | Status: DC

## 2019-04-06 MED ORDER — ALBUTEROL SULFATE HFA 108 (90 BASE) MCG/ACT IN AERS: 2.0000 | INHALATION_SPRAY | Freq: Four times a day (QID) | RESPIRATORY_TRACT | 1 refills | Status: AC | PRN

## 2019-04-06 NOTE — Patient Instructions (Addendum)
   1. Epi-Pen / AUVI-Q 0.3, Benadryl, MD/ER evaluation for allergic reaction  2.  Flonase - 1-2 sprays each nostril 1 time per day during upper airway activity  3.  If needed:   A.  OTC antihistamine  B.  Albuterol HFA-2 inhalations every 4-6 hours  4.  Obtain flu vaccine and Covid vaccine  5. Return in 12 months or earlier if there is a problem.

## 2019-04-06 NOTE — Progress Notes (Signed)
[facility] [place] [facility] [place]   Follow-up Note  Referring Provider: Lajean Manes, MD Primary Provider: Lajean Manes, MD Date of Office Visit: 04/06/2019  Subjective:   Barbara Guerrero (DOB: 1955/07/25) is a 63 y.o. female who returns to the [facility] on 04/06/2019 in re-evaluation of the following:  HPI: Breyana presents to this clinic in evaluation of asthma and allergic rhinitis and a history of food allergy.  She was last seen in this clinic by our nurse practitioner on 16 April 2017 with a acute exacerbation of respiratory tract disease.  I last saw her in this clinic on 12 November 2016.  Her very mild intermittent asthma is under excellent control with rare use of a short acting bronchodilator.  It does not sound as though she has required a systemic steroid to treat an exacerbation.  Likewise she has had very little problems with her upper airways and has not required an antibiotic to treat an episode of sinusitis.  She is now eating a small piece of shrimp.  She still remains away from crab as that was the agent that gave rise to a allergic reaction in the past.  She did not obtain the flu vaccine.  Recently she tripped around Thanksgiving and developed some trauma to her right arm and shoulder and foot but apparently recovered from that injury.  Allergies as of 04/06/2019      Reactions   Shellfish Allergy Itching      Medication List      albuterol 108 (90 Base) MCG/ACT inhaler Commonly known as: VENTOLIN HFA Inhale 2 puffs into the lungs every 6 (six) hours as needed for wheezing or shortness of breath.   diphenhydrAMINE 25 MG tablet Commonly known as: BENADRYL Take 25 mg by mouth every 6 (six) hours as needed.   EPINEPHrine 0.3 mg/0.3 mL Soaj injection Commonly known as: EpiPen 2-Pak EpiPen 2-Pak 0.3 mg/0.3 mL injection, auto-injector   fluticasone 50 MCG/ACT nasal spray Commonly known as: FLONASE Place 2  sprays into both nostrils daily.   VITAMIN B 12 PO Take 1 tablet by mouth daily.       Past Medical History:  Diagnosis Date  . Amenorrhea   . Asthma   . Barium enema abnormal   . Breast cyst   . Family history of breast cancer in first degree relative   . Fibroids   . Lactose intolerance   . Mastodynia   . Pelvic pain in female   . Rectal fissure     Past Surgical History:  Procedure Laterality Date  . BRAIN SURGERY  2003  . BREAST SURGERY  1977  . CESAREAN SECTION  1980/1985  . DIAGNOSTIC LAPAROSCOPY      Review of systems negative except as noted in HPI / PMHx or noted below:  Review of Systems  Constitutional: Negative.   HENT: Negative.   Eyes: Negative.   Respiratory: Negative.   Cardiovascular: Negative.   Gastrointestinal: Negative.   Genitourinary: Negative.   Musculoskeletal: Negative.   Skin: Negative.   Neurological: Negative.   Endo/Heme/Allergies: Negative.   Psychiatric/Behavioral: Negative.      Objective:   Vitals:   04/06/19 1524  BP: (!) 162/92  Pulse: 80  Resp: 18  Temp: 98.1 F (36.7 C)  SpO2: 95%   Height: 5' 1.75" (156.8 cm)  Weight: 236 lb 9.6 oz (107.3 kg)   Physical Exam Constitutional:      Appearance: She is not diaphoretic.  HENT:     Head: Normocephalic.     Right Ear: Tympanic membrane, ear canal and external ear normal.     Left Ear: Tympanic membrane, ear canal and external ear normal.     Nose: Nose normal. No mucosal edema or rhinorrhea.     Mouth/Throat:     Pharynx: Uvula midline. No oropharyngeal exudate.  Eyes:     Conjunctiva/sclera: Conjunctivae normal.  Neck:     Thyroid: No thyromegaly.     Trachea: Trachea normal. No tracheal tenderness or tracheal deviation.  Cardiovascular:     Rate and Rhythm: Normal rate and regular rhythm.     Heart sounds: Normal heart sounds, S1 normal and S2 normal. No murmur.  Pulmonary:     Effort: No respiratory distress.     Breath sounds: Normal breath sounds. No  stridor. No wheezing or rales.  Lymphadenopathy:     Head:     Right side of head: No tonsillar adenopathy.     Left side of head: No tonsillar adenopathy.     Cervical: No cervical adenopathy.  Skin:    Findings: No erythema or rash.     Nails: There is no clubbing.  Neurological:     Mental Status: She is alert.     Diagnostics:    Spirometry was performed and demonstrated an FEV1 of 1.76 at 95 % of predicted.  Assessment and Plan:   1. Asthma, mild intermittent, well-controlled   2. Other allergic rhinitis   3. Allergy with anaphylaxis due to food, subsequent encounter     1. Epi-Pen / AUVI-Q 0.3, Benadryl, MD/ER evaluation for allergic reaction  2.  Flonase - 1-2 sprays each nostril 1 time per day during upper airway activity  3.  If needed:   A.  OTC antihistamine  B.  Albuterol HFA-2 inhalations every 4-6 hours  4.  Obtain flu vaccine and Covid vaccine  5. Return in 12 months or earlier if there is a problem.  Shadiyah is really doing much better at this point in time and she has excellent control of her atopic respiratory disease and her food allergy on her current plan of avoidance measures, intermittent use of anti-inflammatory agents for airway inflammation, and symptomatic medications if required.  I will see her back in this clinic in 12 months or earlier if there is a problem.  Allena Katz, MD Allergy / [facility]ology [place]

## 2019-04-07 ENCOUNTER — Encounter: Payer: Self-pay | Admitting: Allergy and Immunology

## 2019-04-07 NOTE — Addendum Note (Signed)
Addended by: Neomia Dear on: 04/07/2019 12:25 PM   Modules accepted: Orders

## 2019-04-22 DIAGNOSIS — S63501A Unspecified sprain of right wrist, initial encounter: Secondary | ICD-10-CM | POA: Diagnosis not present

## 2019-04-22 DIAGNOSIS — S92325A Nondisplaced fracture of second metatarsal bone, left foot, initial encounter for closed fracture: Secondary | ICD-10-CM | POA: Diagnosis not present

## 2019-05-19 DIAGNOSIS — S92325D Nondisplaced fracture of second metatarsal bone, left foot, subsequent encounter for fracture with routine healing: Secondary | ICD-10-CM | POA: Diagnosis not present

## 2019-06-26 ENCOUNTER — Ambulatory Visit: Payer: BC Managed Care – PPO | Attending: Internal Medicine

## 2019-06-26 DIAGNOSIS — Z23 Encounter for immunization: Secondary | ICD-10-CM

## 2019-06-26 NOTE — Progress Notes (Signed)
   Covid-19 Vaccination Clinic  Name:  Barbara Guerrero    MRN: [id number] DOB: 1955-08-09  06/26/2019  [name] was observed post Covid-19 immunization for 15 minutes without incident. She was provided with Vaccine Information Sheet and instruction to access the V-Safe system.   [name] was instructed to call 911 with any severe reactions post vaccine: Marland Kitchen Difficulty breathing  . Swelling of face and throat  . A fast heartbeat  . A bad rash all over body  . Dizziness and weakness   Immunizations Administered    Name Date Dose VIS Date Route   Pfizer COVID-19 Vaccine 06/26/2019  1:12 PM 0.3 mL 03/26/2019 Intramuscular   Manufacturer: [place]   Lot: [id number]   [place]: [id number]

## 2019-07-01 DIAGNOSIS — H2513 Age-related nuclear cataract, bilateral: Secondary | ICD-10-CM | POA: Diagnosis not present

## 2019-07-01 DIAGNOSIS — H04123 Dry eye syndrome of bilateral lacrimal glands: Secondary | ICD-10-CM | POA: Diagnosis not present

## 2019-07-01 DIAGNOSIS — H5203 Hypermetropia, bilateral: Secondary | ICD-10-CM | POA: Diagnosis not present

## 2019-07-01 DIAGNOSIS — H524 Presbyopia: Secondary | ICD-10-CM | POA: Diagnosis not present

## 2019-07-19 ENCOUNTER — Telehealth: Payer: Self-pay | Admitting: Allergy and Immunology

## 2019-07-20 ENCOUNTER — Ambulatory Visit: Payer: BC Managed Care – PPO | Attending: Internal Medicine

## 2019-07-20 DIAGNOSIS — Z23 Encounter for immunization: Secondary | ICD-10-CM

## 2019-07-20 NOTE — Progress Notes (Signed)
   Covid-19 Vaccination Clinic  Name:  Barbara Guerrero    MRN: [id number] DOB: 1955-08-06  07/20/2019  Barbara Guerrero was observed post Covid-19 immunization for 15 minutes without incident. She was provided with Vaccine Information Sheet and instruction to access the V-Safe system.   Barbara Guerrero was instructed to call 911 with any severe reactions post vaccine: Marland Kitchen Difficulty breathing  . Swelling of face and throat  . A fast heartbeat  . A bad rash all over body  . Dizziness and weakness   Immunizations Administered    Name Date Dose VIS Date Route   Pfizer COVID-19 Vaccine 07/20/2019 12:31 PM 0.3 mL 03/26/2019 Intramuscular   Manufacturer: [place]   Lot: [id number]   [place]: [id number]

## 2020-02-29 ENCOUNTER — Ambulatory Visit: Payer: 59 | Admitting: Allergy and Immunology

## 2020-06-23 ENCOUNTER — Ambulatory Visit: Payer: BC Managed Care – PPO

## 2020-06-26 ENCOUNTER — Ambulatory Visit: Payer: BC Managed Care – PPO | Attending: Internal Medicine

## 2020-06-26 DIAGNOSIS — Z23 Encounter for immunization: Secondary | ICD-10-CM

## 2020-06-27 ENCOUNTER — Other Ambulatory Visit (HOSPITAL_COMMUNITY): Payer: Self-pay | Admitting: Internal Medicine

## 2020-10-26 DIAGNOSIS — M170 Bilateral primary osteoarthritis of knee: Secondary | ICD-10-CM | POA: Diagnosis not present

## 2020-10-26 DIAGNOSIS — M25571 Pain in right ankle and joints of right foot: Secondary | ICD-10-CM | POA: Diagnosis not present

## 2020-11-02 DIAGNOSIS — M170 Bilateral primary osteoarthritis of knee: Secondary | ICD-10-CM | POA: Diagnosis not present

## 2020-11-06 DIAGNOSIS — M19071 Primary osteoarthritis, right ankle and foot: Secondary | ICD-10-CM | POA: Diagnosis not present

## 2020-12-04 DIAGNOSIS — R350 Frequency of micturition: Secondary | ICD-10-CM | POA: Diagnosis not present

## 2020-12-04 DIAGNOSIS — M545 Low back pain, unspecified: Secondary | ICD-10-CM | POA: Diagnosis not present

## 2021-01-02 DIAGNOSIS — N3281 Overactive bladder: Secondary | ICD-10-CM | POA: Diagnosis not present

## 2021-01-02 DIAGNOSIS — Z780 Asymptomatic menopausal state: Secondary | ICD-10-CM | POA: Diagnosis not present

## 2021-01-02 DIAGNOSIS — R350 Frequency of micturition: Secondary | ICD-10-CM | POA: Diagnosis not present

## 2021-01-02 DIAGNOSIS — Z01419 Encounter for gynecological examination (general) (routine) without abnormal findings: Secondary | ICD-10-CM | POA: Diagnosis not present

## 2021-01-15 DIAGNOSIS — R635 Abnormal weight gain: Secondary | ICD-10-CM | POA: Diagnosis not present

## 2021-01-25 DIAGNOSIS — R7303 Prediabetes: Secondary | ICD-10-CM | POA: Diagnosis not present

## 2021-01-25 DIAGNOSIS — E782 Mixed hyperlipidemia: Secondary | ICD-10-CM | POA: Diagnosis not present

## 2021-01-25 DIAGNOSIS — M170 Bilateral primary osteoarthritis of knee: Secondary | ICD-10-CM | POA: Diagnosis not present

## 2021-02-08 DIAGNOSIS — M542 Cervicalgia: Secondary | ICD-10-CM | POA: Diagnosis not present

## 2021-02-08 DIAGNOSIS — M546 Pain in thoracic spine: Secondary | ICD-10-CM | POA: Diagnosis not present

## 2021-02-14 DIAGNOSIS — S29012D Strain of muscle and tendon of back wall of thorax, subsequent encounter: Secondary | ICD-10-CM | POA: Diagnosis not present

## 2021-02-14 DIAGNOSIS — M62830 Muscle spasm of back: Secondary | ICD-10-CM | POA: Diagnosis not present

## 2021-02-15 DIAGNOSIS — Z713 Dietary counseling and surveillance: Secondary | ICD-10-CM | POA: Diagnosis not present

## 2021-02-21 DIAGNOSIS — M62830 Muscle spasm of back: Secondary | ICD-10-CM | POA: Diagnosis not present

## 2021-02-21 DIAGNOSIS — S29012D Strain of muscle and tendon of back wall of thorax, subsequent encounter: Secondary | ICD-10-CM | POA: Diagnosis not present

## 2021-02-27 ENCOUNTER — Other Ambulatory Visit: Payer: Self-pay

## 2021-02-27 ENCOUNTER — Ambulatory Visit (INDEPENDENT_AMBULATORY_CARE_PROVIDER_SITE_OTHER): Payer: BC Managed Care – PPO | Admitting: Allergy & Immunology

## 2021-02-27 ENCOUNTER — Encounter: Payer: Self-pay | Admitting: Allergy & Immunology

## 2021-02-27 VITALS — BP 148/90 | HR 93 | Temp 97.6°F | Resp 24 | Ht 61.42 in | Wt 252.2 lb

## 2021-02-27 DIAGNOSIS — J4520 Mild intermittent asthma, uncomplicated: Secondary | ICD-10-CM | POA: Diagnosis not present

## 2021-02-27 DIAGNOSIS — J301 Allergic rhinitis due to pollen: Secondary | ICD-10-CM | POA: Diagnosis not present

## 2021-02-27 DIAGNOSIS — T7800XD Anaphylactic reaction due to unspecified food, subsequent encounter: Secondary | ICD-10-CM | POA: Diagnosis not present

## 2021-02-27 DIAGNOSIS — T7840XD Allergy, unspecified, subsequent encounter: Secondary | ICD-10-CM

## 2021-02-27 NOTE — Progress Notes (Signed)
FOLLOW UP  Date of Service/Encounter:  02/27/21   Assessment:   Mild intermittent asthma, uncomplicated  Anaphylaxis due to food  Seasonal allergic rhinitis  Allergic reaction   Plan/Recommendations:   1. Allergic reaction - I think this was likely related to either dust on the trees or increase in mold spores. - We are going to treat aggressively with Nasacort 1 spray per nostril twice daily and Allegra 1 tablet daily. - Continue this for two weeks (but give [place] an update in a couple of days). - If you are still having issues in a couple of days, we may send in prednisone.  2. Anaphylactic shock due to food - EpiPen refilled today. - Introduce seafood as you feel comfortable at home, since you have already started and been so successful.  3. Seasonal allergic rhinitis due to pollen - In the future, use Nasacort aggressively 1 spray per nostril 1-2 times daily and an antihistamine such as Allegra 1 tablet daily. - You can use these on an as-needed basis.  4. Mild intermittent asthma, uncomplicated - Lung testing look great today. - Continue with Proventil 2 puffs every 4-6 hours as needed.  5. Return in about 1 year (around 02/27/2022).  BUT CALL IN TWO DAYS!    Subjective:   Barbara Guerrero is a 65 y.o. female presenting today for follow up of  Chief Complaint  Patient presents with   Follow-up    Sunday went pecan picking without a mask because it was hot. Afterwards she had itching, nose running "like a faucet", eyes itchy.    Barbara Guerrero has a history of the following: Patient Active Problem List   Diagnosis Date Noted   Other allergic rhinitis 04/16/2017   Allergy with anaphylaxis due to food, subsequent encounter 04/16/2017   Asthma, well controlled 04/16/2017   Family history of breast cancer in first degree relative     History obtained from: chart review and patient.  Barbara Guerrero is a 65 y.o. female presenting for a sick visit.  She was last  seen in December 2020 by Dr. Neldon Mc.  At that time, she seemed to be doing much better.  He felt that she had her atopic respiratory disease and food allergies under good control.  She was supposed to follow back up in 1 year, but shows up 2 years later.  Since the last visit, she has mostly done well. She went with her husband to pick pecans. She had a mask on and it got to be too warm. She noticed that there were "little things" coming out of the tree. She reports that it looked like dust. She then went home after this and developed itching eyes and runny nose. She picked the pecans two days ago. Symptoms started Sunday night and she was itching having a runny nose like a faucet.   She is not allergic to pecans at all. She thinks it was the dust coming from the trees. She has a known history of shellfish allergy. She had testing that was largely reassuring in July 2018 and she was told to introduce these into her diet. She now does eat a lot of this seafood but she avoids reaching a threshold of the food so that she does not have a reaction. She does not have an up to date EpiPen at all.  Asthma/Respiratory Symptom History: She has Proventil to use as needed. She does not use it very often. It typically does expire before she gets a  new one. Turn of the seasons ar a particular trigger for her symptoms.   Otherwise, there have been no changes to her past medical history, surgical history, family history, or social history.    Review of Systems  Constitutional: Negative.  Negative for chills, fever, malaise/fatigue and weight loss.  HENT: Negative.  Negative for congestion, ear discharge, ear pain and sinus pain.   Eyes:  Negative for pain, discharge and redness.  Respiratory:  Negative for cough, sputum production, shortness of breath and wheezing.   Cardiovascular: Negative.  Negative for chest pain and palpitations.  Gastrointestinal:  Negative for abdominal pain, constipation, diarrhea,  heartburn, nausea and vomiting.  Skin: Negative.  Negative for itching and rash.  Neurological:  Negative for dizziness and headaches.  Endo/Heme/Allergies:  Negative for environmental allergies. Does not bruise/bleed easily.      Objective:   Blood pressure (!) 148/90, pulse 93, temperature 97.6 F (36.4 C), temperature source Temporal, resp. rate (!) 24, height 5' 1.42" (1.56 m), weight 252 lb 3.2 oz (114.4 kg), SpO2 97 %. Body mass index is 47.01 kg/m.   Physical Exam:  Physical Exam Vitals reviewed.  Constitutional:      Appearance: She is well-developed.  HENT:     Head: Normocephalic and atraumatic.     Right Ear: Tympanic membrane, ear canal and external ear normal.     Left Ear: Tympanic membrane, ear canal and external ear normal.     Nose: No nasal deformity, septal deviation, mucosal edema or rhinorrhea.     Right Turbinates: Enlarged, swollen and pale.     Left Turbinates: Enlarged, swollen and pale.     Right Sinus: No maxillary sinus tenderness or frontal sinus tenderness.     Left Sinus: No maxillary sinus tenderness or frontal sinus tenderness.     Mouth/Throat:     Mouth: Mucous membranes are not pale and not dry.     Pharynx: Uvula midline.  Eyes:     General: Lids are normal. Allergic shiner present.        Right eye: Discharge present.        Left eye: Discharge present.    Conjunctiva/sclera: Conjunctivae normal.     Right eye: Right conjunctiva is not injected. No chemosis.    Left eye: Left conjunctiva is not injected. No chemosis.    Pupils: Pupils are equal, round, and reactive to light.     Comments: Clear discharge bilaterally.   Cardiovascular:     Rate and Rhythm: Normal rate and regular rhythm.     Heart sounds: Normal heart sounds.  Pulmonary:     Effort: Pulmonary effort is normal. No tachypnea, accessory muscle usage or respiratory distress.     Breath sounds: Normal breath sounds. No wheezing, rhonchi or rales.  Chest:     Chest wall:  No tenderness.  Lymphadenopathy:     Cervical: No cervical adenopathy.  Skin:    Coloration: Skin is not pale.     Findings: No abrasion, erythema, petechiae or rash. Rash is not papular, urticarial or vesicular.  Neurological:     Mental Status: She is alert.  Psychiatric:        Behavior: Behavior is cooperative.     Diagnostic studies:    Spirometry: results normal (FEV1: 1.51/81%, FVC: 1.94/83%, FEV1/FVC: 78%).    Spirometry consistent with normal pattern.   Allergy Studies: none        Salvatore Marvel, MD  [facility]

## 2021-02-27 NOTE — Patient Instructions (Addendum)
1. Allergic reaction, subsequent encounter - I think this was likely related to either dust on the trees or increase in mold spores. - We are going to treat aggressively with Nasacort 1 spray per nostril twice daily and Allegra 1 tablet daily. - Continue this for two weeks (but give [place] an update in a couple of days). - If you are still having issues in a couple of days, we may send in prednisone.  2. Anaphylactic shock due to food - EpiPen refilled today. - Introduce seafood as you feel comfortable at home, since you have already started and been so successful.  3. Seasonal allergic rhinitis due to pollen - In the future, use Nasacort aggressively 1 spray per nostril 1-2 times daily and an antihistamine such as Allegra 1 tablet daily. - You can use these on an as-needed basis.  4. Mild intermittent asthma, uncomplicated - Lung testing look great today. - Continue with Proventil 2 puffs every 4-6 hours as needed.  5. Return in about 1 year (around 02/27/2022).  BUT CALL IN TWO DAYS!    Please inform us of any Emergency Department visits, hospitalizations, or changes in symptoms. Call us before going to the ED for breathing or allergy symptoms since we might be able to fit you in for a sick visit. Feel free to contact us anytime with any questions, problems, or concerns.  It was a pleasure to meet you today! KEEP ON SINGING!  Websites that have reliable patient information: 1. American Academy of Asthma, Allergy, and Immunology: www.aaaai.org 2. Food Allergy Research and Education (FARE): foodallergy.org 3. Mothers of Asthmatics: http://www.asthmacommunitynetwork.org 4. American College of Allergy, Asthma, and Immunology: www.acaai.org   COVID-19 Vaccine Information can be found at: [email or web address].[email or web address].uk For questions related to vaccine distribution or appointments, please email [email or web address] or call [phone].   We  realize that you might be concerned about having an allergic reaction to the COVID19 vaccines. To help with that concern, WE ARE OFFERING THE COVID19 VACCINES IN OUR OFFICE! Ask the front desk for dates!     "Like" [place] on Facebook and Instagram for our latest updates!      A healthy democracy works best when [employer] participate! Make sure you are registered to vote! If you have moved or changed any of your contact information, you will need to get this updated before voting!  In some cases, you MAY be able to register to vote online: [email or web address]

## 2021-03-05 ENCOUNTER — Other Ambulatory Visit: Payer: Self-pay | Admitting: *Deleted

## 2021-03-05 DIAGNOSIS — S29012D Strain of muscle and tendon of back wall of thorax, subsequent encounter: Secondary | ICD-10-CM | POA: Diagnosis not present

## 2021-03-05 DIAGNOSIS — M62830 Muscle spasm of back: Secondary | ICD-10-CM | POA: Diagnosis not present

## 2021-03-05 MED ORDER — VENTOLIN HFA 108 (90 BASE) MCG/ACT IN AERS: 2.0000 | INHALATION_SPRAY | Freq: Four times a day (QID) | RESPIRATORY_TRACT | 1 refills | Status: AC | PRN

## 2021-03-05 MED ORDER — EPINEPHRINE 0.3 MG/0.3ML IJ SOAJ: INTRAMUSCULAR | 3 refills | Status: AC

## 2021-03-30 DIAGNOSIS — Z713 Dietary counseling and surveillance: Secondary | ICD-10-CM | POA: Diagnosis not present

## 2021-03-30 DIAGNOSIS — Z6841 Body Mass Index (BMI) 40.0 and over, adult: Secondary | ICD-10-CM | POA: Diagnosis not present

## 2021-04-30 DIAGNOSIS — M170 Bilateral primary osteoarthritis of knee: Secondary | ICD-10-CM | POA: Diagnosis not present

## 2021-05-16 NOTE — Telephone Encounter (Signed)
error 

## 2021-05-28 DIAGNOSIS — R7303 Prediabetes: Secondary | ICD-10-CM | POA: Diagnosis not present

## 2021-05-28 DIAGNOSIS — M170 Bilateral primary osteoarthritis of knee: Secondary | ICD-10-CM | POA: Diagnosis not present

## 2021-05-28 DIAGNOSIS — R030 Elevated blood-pressure reading, without diagnosis of hypertension: Secondary | ICD-10-CM | POA: Diagnosis not present

## 2021-05-28 DIAGNOSIS — E782 Mixed hyperlipidemia: Secondary | ICD-10-CM | POA: Diagnosis not present

## 2021-06-07 DIAGNOSIS — Z713 Dietary counseling and surveillance: Secondary | ICD-10-CM | POA: Diagnosis not present

## 2021-08-01 DIAGNOSIS — R8279 Other abnormal findings on microbiological examination of urine: Secondary | ICD-10-CM | POA: Diagnosis not present

## 2021-08-01 DIAGNOSIS — N3281 Overactive bladder: Secondary | ICD-10-CM | POA: Diagnosis not present

## 2021-08-01 DIAGNOSIS — N3941 Urge incontinence: Secondary | ICD-10-CM | POA: Diagnosis not present

## 2021-08-01 DIAGNOSIS — R8271 Bacteriuria: Secondary | ICD-10-CM | POA: Diagnosis not present

## 2021-08-02 DIAGNOSIS — G8929 Other chronic pain: Secondary | ICD-10-CM | POA: Diagnosis not present

## 2021-08-02 DIAGNOSIS — M7041 Prepatellar bursitis, right knee: Secondary | ICD-10-CM | POA: Diagnosis not present

## 2021-08-02 DIAGNOSIS — M7042 Prepatellar bursitis, left knee: Secondary | ICD-10-CM | POA: Diagnosis not present

## 2021-08-02 DIAGNOSIS — M25561 Pain in right knee: Secondary | ICD-10-CM | POA: Diagnosis not present

## 2021-08-03 DIAGNOSIS — Z713 Dietary counseling and surveillance: Secondary | ICD-10-CM | POA: Diagnosis not present

## 2021-08-03 DIAGNOSIS — Z6841 Body Mass Index (BMI) 40.0 and over, adult: Secondary | ICD-10-CM | POA: Diagnosis not present

## 2021-08-07 DIAGNOSIS — R7303 Prediabetes: Secondary | ICD-10-CM | POA: Diagnosis not present

## 2021-08-07 DIAGNOSIS — E785 Hyperlipidemia, unspecified: Secondary | ICD-10-CM | POA: Diagnosis not present

## 2021-08-07 DIAGNOSIS — E669 Obesity, unspecified: Secondary | ICD-10-CM | POA: Diagnosis not present

## 2021-08-07 DIAGNOSIS — R030 Elevated blood-pressure reading, without diagnosis of hypertension: Secondary | ICD-10-CM | POA: Diagnosis not present

## 2021-08-30 ENCOUNTER — Ambulatory Visit
Admission: EM | Admit: 2021-08-30 | Discharge: 2021-08-30 | Disposition: A | Discharge status: Home / Self Care | Payer: BC Managed Care – PPO | Attending: Urgent Care | Admitting: Urgent Care

## 2021-08-30 ENCOUNTER — Encounter: Payer: Self-pay | Admitting: Emergency Medicine

## 2021-08-30 DIAGNOSIS — I10 Essential (primary) hypertension: Secondary | ICD-10-CM

## 2021-08-30 DIAGNOSIS — E669 Obesity, unspecified: Secondary | ICD-10-CM

## 2021-08-30 DIAGNOSIS — R030 Elevated blood-pressure reading, without diagnosis of hypertension: Secondary | ICD-10-CM

## 2021-08-30 DIAGNOSIS — R609 Edema, unspecified: Secondary | ICD-10-CM | POA: Diagnosis not present

## 2021-08-30 HISTORY — DX: Essential (primary) hypertension: I10

## 2021-08-30 MED ORDER — FUROSEMIDE 40 MG PO TABS: 40.0000 mg | ORAL_TABLET | Freq: Every day | ORAL | 0 refills | Status: AC

## 2021-08-30 MED ORDER — HYDROCHLOROTHIAZIDE 12.5 MG PO TABS: 12.5000 mg | ORAL_TABLET | Freq: Every day | ORAL | 0 refills | Status: DC

## 2021-08-30 MED ORDER — POTASSIUM CHLORIDE ER 10 MEQ PO TBCR: 10.0000 meq | EXTENDED_RELEASE_TABLET | Freq: Every day | ORAL | 0 refills | Status: DC

## 2021-08-30 NOTE — ED Triage Notes (Signed)
Pt here with bilateral pitting edema for about a week. Has hx of HTN, but is not taking any medication for it. Trying to establish care with a PCP.

## 2021-08-30 NOTE — ED Provider Notes (Signed)
[place]   MRN: [id number] DOB: 1955-08-03  Subjective:   Barbara Guerrero is a 66 y.o. female presenting for 1 week history of swelling of her lower legs and ankles. No falls, trauma.  No chest pain, shortness of breath or wheezing.  She has a history of hypertension but not heart failure.  Has not been taking her blood pressure medications.  Patient is working on weight loss through a program.  Unfortunately she eats out every day at different restaurants.  States that she does not eat fast foods however.  She is currently in the process of trying to find a new primary care provider.  No current facility-administered medications for this encounter.  Current Outpatient Medications:    acetaminophen (TYLENOL) 500 MG tablet, Take by mouth., Disp: , Rfl:    albuterol (VENTOLIN HFA) 108 (90 Base) MCG/ACT inhaler, Inhale 2 puffs into the lungs every 6 (six) hours as needed for wheezing or shortness of breath., Disp: 18 g, Rfl: 1   Cyanocobalamin (VITAMIN B 12 PO), Take 1 tablet by mouth daily., Disp: , Rfl:    diphenhydrAMINE (BENADRYL) 25 MG tablet, Take 25 mg by mouth every 6 (six) hours as needed. (Patient not taking: Reported on 02/27/2021), Disp: , Rfl:    EPINEPHrine (EPIPEN 2-PAK) 0.3 mg/0.3 mL IJ SOAJ injection, EpiPen 2-Pak 0.3 mg/0.3 mL injection, auto-injector, Disp: 2 each, Rfl: 3   fluticasone (FLONASE) 50 MCG/ACT nasal spray, Place 2 sprays into both nostrils daily., Disp: 16 g, Rfl: 2   VENTOLIN HFA 108 (90 Base) MCG/ACT inhaler, Inhale 2 puffs into the lungs every 6 (six) hours as needed for wheezing or shortness of breath., Disp: 18 g, Rfl: 1   Allergies  Allergen Reactions   Shellfish Allergy Itching    Past Medical History:  Diagnosis Date   Amenorrhea    Asthma    Barium enema abnormal    Breast cyst    Family history of breast cancer in first degree relative    Fibroids    Hypertension    Lactose intolerance    Mastodynia    Pelvic  pain in female    Rectal fissure      Past Surgical History:  Procedure Laterality Date   BRAIN SURGERY  2003   [place]  1980/1985   DIAGNOSTIC LAPAROSCOPY      Family History  Problem Relation Age of Onset   Hypertension Mother    Cancer Sister        breast cancer    Social History   Tobacco Use   Smoking status: Never   Smokeless tobacco: Never  Vaping Use   Vaping Use: Never used  Substance Use Topics   Alcohol use: No   Drug use: No    ROS   Objective:   Vitals: BP (!) 180/84   Pulse 80   Temp 98.1 F (36.7 C)   Resp 20   SpO2 98%   BP recheck 168/102.   BP Readings from Last 3 Encounters:  08/30/21 (!) 180/84  02/27/21 (!) 148/90  04/06/19 (!) 162/92   Physical Exam Constitutional:      General: She is not in acute distress.    Appearance: Normal appearance. She is well-developed. She is not ill-appearing, toxic-appearing or diaphoretic.  HENT:     Head: Normocephalic and atraumatic.     Nose: Nose normal.     Mouth/Throat:     Mouth: Mucous  membranes are moist.  Eyes:     General: No scleral icterus.       Right eye: No discharge.        Left eye: No discharge.     Extraocular Movements: Extraocular movements intact.  Neck:     Vascular: No carotid bruit.  Cardiovascular:     Rate and Rhythm: Normal rate and regular rhythm.     Heart sounds: Normal heart sounds. No murmur heard.   No friction rub. No gallop.  Pulmonary:     Effort: Pulmonary effort is normal. No respiratory distress.     Breath sounds: No stridor. No wheezing, rhonchi or rales.  Chest:     Chest wall: No tenderness.  Musculoskeletal:     Right lower leg: Edema (1+) present.     Left lower leg: Edema (1+) present.  Skin:    General: Skin is warm and dry.  Neurological:     General: No focal deficit present.     Mental Status: She is alert and oriented to person, place, and time.     Cranial Nerves: No cranial nerve deficit.      Motor: No weakness.     Coordination: Coordination normal.     Gait: Gait normal.     Deep Tendon Reflexes: Reflexes normal.  Psychiatric:        Mood and Affect: Mood normal.        Behavior: Behavior normal.        Thought Content: Thought content normal.        Judgment: Judgment normal.    Assessment and Plan :   PDMP not reviewed this encounter.  1. Essential hypertension   2. Elevated blood pressure reading   3. Pitting edema   4. Obesity without serious comorbidity, unspecified classification, unspecified obesity type    Low suspicion for an acute encephalopathy, stroke, congestive heart failure.  She has a clear cardiopulmonary exam and a normal neurologic exam and therefore despite her elevated blood pressure will defer an ER visit.  Discussed the fact that diet choices are one of the primary sources that can cause fluid retention due to the sodium levels upper restaurant foods.  Emphasized need for hypertensive friendly diet and reviewed this at length with her as well.  Recommended that she start Lasix now to help her with her current peripheral edema.  We will be using the short-term and supplementing with potassium.  Thereafter she is to switch to hydrochlorothiazide.  Recommended she establish care with a new primary care provider and provided her with information for this. Counseled patient on potential for adverse effects with medications prescribed/recommended today, ER and return-to-clinic precautions discussed, patient verbalized understanding.    Jaynee Eagles, PA-C 08/30/21 1310

## 2021-08-30 NOTE — Discharge Instructions (Addendum)
Start with Lasix (furosemide) to get the fluid out of your lower legs. Take this with potassium. After that start taking hydrochlorothiazide.    For diabetes or elevated blood sugar, please make sure you are limiting and avoiding starchy, carbohydrate foods like pasta, breads, sweet breads, pastry, rice, potatoes, desserts. These foods can elevate your blood sugar. Also, limit and avoid drinks that contain a lot of sugar such as sodas, sweet teas, fruit juices.  Drinking plain water will be much more helpful, try 64 ounces of water daily.  It is okay to flavor your water naturally by cutting cucumber, lemon, mint or lime, placing it in a picture with water and drinking it over a period of 24-48 hours as long as it remains refrigerated.  For elevated blood pressure, make sure you are monitoring salt in your diet.  Do not eat restaurant foods and limit processed foods at home. I highly recommend you prepare and cook your own foods at home.  Processed foods include things like frozen meals, pre-seasoned meats and dinners, deli meats, canned foods as these foods contain a high amount of sodium/salt.  Make sure you are paying attention to sodium labels on foods you buy at the grocery store. Buy your spices separately such as garlic powder, onion powder, cumin, cayenne, parsley flakes so that you can avoid seasonings that contain salt. However, salt-free seasonings are available and can be used, an example is Mrs. Dash and includes a lot of different mixtures that do not contain salt.  Lastly, when cooking using oils that are healthier for you is important. This includes olive oil, avocado oil, canola oil. We have discussed a lot of foods to avoid but below is a list of foods that can be very healthy to use in your diet whether it is for diabetes, cholesterol, high blood pressure, or in general healthy eating.  Salads - kale, spinach, cabbage, spring mix, arugula Fruits - avocadoes, berries (blueberries,  raspberries, blackberries), apples, oranges, pomegranate, grapefruit, kiwi Vegetables - asparagus, cauliflower, broccoli, green beans, brussel sprouts, bell peppers, beets; stay away from or limit starchy vegetables like potatoes, carrots, peas Other general foods - kidney beans, egg whites, almonds, walnuts, sunflower seeds, pumpkin seeds, fat free yogurt, almond milk, flax seeds, quinoa, oats  Meat - It is better to eat lean meats and limit your red meat including pork to once a week.  Wild caught fish, chicken breast are good options as they tend to be leaner sources of good protein. Still be mindful of the sodium labels for the meats you buy.  DO NOT EAT ANY FOODS ON THIS LIST THAT YOU ARE ALLERGIC TO. For more specific needs, I highly recommend consulting a dietician or nutritionist but this can definitely be a good starting point.

## 2021-09-13 ENCOUNTER — Ambulatory Visit: Payer: BC Managed Care – PPO | Admitting: Family Medicine

## 2021-09-13 ENCOUNTER — Telehealth: Payer: Self-pay | Admitting: Family Medicine

## 2021-09-13 NOTE — Telephone Encounter (Signed)
Pt was a no show for a NP app with Dr. Grandville Silos on 09/13/21, I sent a no show letter.

## 2021-09-18 ENCOUNTER — Other Ambulatory Visit: Payer: Self-pay | Admitting: Obstetrics and Gynecology

## 2021-09-18 ENCOUNTER — Ambulatory Visit (INDEPENDENT_AMBULATORY_CARE_PROVIDER_SITE_OTHER): Payer: BC Managed Care – PPO | Admitting: Obstetrics and Gynecology

## 2021-09-18 ENCOUNTER — Encounter: Payer: Self-pay | Admitting: Obstetrics and Gynecology

## 2021-09-18 VITALS — BP 117/81 | HR 82 | Ht 61.25 in | Wt 237.0 lb

## 2021-09-18 DIAGNOSIS — N3281 Overactive bladder: Secondary | ICD-10-CM | POA: Diagnosis not present

## 2021-09-18 DIAGNOSIS — R319 Hematuria, unspecified: Secondary | ICD-10-CM

## 2021-09-18 DIAGNOSIS — R350 Frequency of micturition: Secondary | ICD-10-CM | POA: Diagnosis not present

## 2021-09-18 LAB — POCT URINALYSIS DIPSTICK
Appearance: NORMAL
Bilirubin, UA: NEGATIVE
Glucose, UA: NEGATIVE
Ketones, UA: NEGATIVE
Leukocytes, UA: NEGATIVE
Nitrite, UA: NEGATIVE
Protein, UA: NEGATIVE
Spec Grav, UA: >=1.03 — AB (ref 1.010–1.025)
Urobilinogen, UA: 0.2 E.U./dL
pH, UA: 5 (ref 5.0–8.0)

## 2021-09-18 MED ORDER — MIRABEGRON ER 25 MG PO TB24: 25.0000 mg | ORAL_TABLET | Freq: Every day | ORAL | 5 refills | Status: DC

## 2021-09-18 NOTE — Progress Notes (Unsigned)
[facility] New Patient Evaluation and Consultation  Referring Provider: Lajean Manes, MD PCP: Lajean Manes, MD Date of Service: 09/18/2021  SUBJECTIVE Chief Complaint: New Patient (Initial Visit) Barbara Guerrero is a 66 y.o. female here for a consult on urinary incontinence.)  History of Present Illness: Barbara Guerrero is a 66 y.o. Black or African-American female presenting for evaluation of urinary incontinence.    Urinary Symptoms: Leaks urine with going from sitting to standing, with movement to the bathroom, with urgency, while asleep, and continuously Leaks 1-2 time(s) per day.  Pad use: 1-2 pads per day.   She is bothered by her UI symptoms. Had prior treatment- Gemtesa which did help but not taking it now. Still had occasional "surge" of urine. Feels she cannot stop the urine.  Previously also prescribed Myrbetriq and took samples which improved samples but was denied by insurance.   Day time voids 4.  Nocturia: 1 times per night to void. Voiding dysfunction: she empties her bladder well.  does not use a catheter to empty bladder.  When urinating, she feels she has no difficulties Drinks:  water, milk, occasional coffee  UTIs:  0  UTI's in the last year.   Denies history of blood in urine and kidney or bladder stones  Pelvic Organ Prolapse Symptoms:                  She Denies a feeling of a bulge the vaginal area.   Bowel Symptom: Bowel movements: 2 time(s) per day Stool consistency: soft  Straining: yes.  Splinting: yes.  Incomplete evacuation: no.  She Denies accidental bowel leakage / fecal incontinence Bowel regimen: none   Sexual Function Sexually active: yes.  Pain with sex: Yes, discomfort  Pelvic Pain Denies pelvic pain  Past Medical History:  Past Medical History:  Diagnosis Date   Amenorrhea    Asthma    Barium enema abnormal    Breast cyst    Family history of breast cancer in first degree relative    Fibroids     Hypertension    Lactose intolerance    Mastodynia    Pelvic pain in female    Rectal fissure      Past Surgical History:   Past Surgical History:  Procedure Laterality Date   BRAIN SURGERY  2003   [place]  1980/1985   DIAGNOSTIC LAPAROSCOPY       Past OB/GYN History: OB History  Gravida Para Term Preterm AB Living  2 2       0  SAB IAB Ectopic Multiple Live Births          0    # Outcome Date GA Lbr Len/2nd Weight Sex Delivery Anes PTL Lv  2 Para           1 Para             Vaginal deliveries: 2,  Forceps/ Vacuum deliveries: 0, Cesarean section: 0 Menopausal: Yes, at age 30, Denies vaginal bleeding since menopause Any history of abnormal pap smears: no.   Medications: She has a current medication list which includes the following prescription(s): acetaminophen, albuterol, cyanocobalamin, diphenhydramine, epinephrine, fluticasone, furosemide, ventolin hfa, and gemtesa.   Allergies: Patient is allergic to shellfish allergy.   Social History:  Social History   Tobacco Use   Smoking status: Never   Smokeless tobacco: Never  Vaping Use   Vaping Use: Never used  Substance Use Topics  Alcohol use: No   Drug use: No    Relationship status: married She lives with husband.   She is not employed. Regular exercise: No History of abuse: No  Family History:   Family History  Problem Relation Age of Onset   Hypertension Mother    Cancer Sister        breast cancer     Review of Systems: Review of Systems  Constitutional:  Negative for fever, malaise/fatigue and weight loss.  Respiratory:  Negative for cough, shortness of breath and wheezing.   Cardiovascular:  Negative for chest pain, palpitations and leg swelling.  Gastrointestinal:  Negative for abdominal pain and blood in stool.  Genitourinary:  Negative for dysuria.  Musculoskeletal:  Negative for myalgias.  Skin:  Negative for rash.  Neurological:  Negative for  dizziness and headaches.  Endo/Heme/Allergies:  Does not bruise/bleed easily.  Psychiatric/Behavioral:  Negative for depression. The patient is not nervous/anxious.     OBJECTIVE Physical Exam: Vitals:   09/18/21 1446  BP: 117/81  Pulse: 82  Weight: 237 lb (107.5 kg)  Height: 5' 1.25" (1.556 m)    Physical Exam Constitutional:      General: She is not in acute distress. Pulmonary:     Effort: Pulmonary effort is normal.  Abdominal:     General: There is no distension.     Palpations: Abdomen is soft.     Tenderness: There is no abdominal tenderness. There is no rebound.  Musculoskeletal:        General: No swelling. Normal range of motion.  Skin:    General: Skin is warm and dry.     Findings: No rash.  Neurological:     Mental Status: She is alert and oriented to person, place, and time.  Psychiatric:        Mood and Affect: Mood normal.        Behavior: Behavior normal.     GU / Detailed Urogynecologic Evaluation:  Pelvic Exam: Normal external female genitalia; Bartholin's and Skene's glands normal in appearance; urethral meatus normal in appearance, no urethral masses or discharge.   CST: negative  Speculum exam reveals normal vaginal mucosa with atrophy. Cervix normal appearance. Uterus normal single, nontender. Adnexa no mass, fullness, tenderness.     Pelvic floor strength III/V  Pelvic floor musculature: Right levator non-tender, Right obturator non-tender, Left levator non-tender, Left obturator non-tender  POP-Q:   POP-Q  -3                                            Aa   -3                                           Ba  -6.5                                              C   4  Gh  5                                            Pb  8                                            tvl   -3                                            Ap  -3                                            Bp  -8                                               D     Rectal Exam:  Normal external rectum  Post-Void Residual (PVR) by Bladder Scan: In order to evaluate bladder emptying, we discussed obtaining a postvoid residual and she agreed to this procedure.  Procedure: The ultrasound unit was placed on the patient's abdomen in the suprapubic region after the patient had voided. A PVR of 5 ml was obtained by bladder scan.  Laboratory Results: POC urine: small blood   ASSESSMENT AND PLAN Ms. Liss is a 66 y.o. with:  1. Urinary frequency   2. Overactive bladder   3. Hematuria, unspecified type     OAB - She would like to restart Myrbetriq- this was ordered for her, but insurance covers [place] instead. Rx for [facility] sent to pharmacy.  - Also reviewed third line therapy options of PTNS, SNM and botox - Referral placed to pelvic PT  2. Blood in urine - small blood noted on POC urine, but urine was disposed before it could be sent for UA microscopic.  - Will plan to obtain repeat urine sample at next visit to r/o hematuria  Return 6 weeks for follow up  Jaquita Folds, MD

## 2021-09-18 NOTE — Patient Instructions (Signed)
We discussed the symptoms of overactive bladder (OAB), which include urinary urgency, urinary frequency, night-time urination, with or without urge incontinence.  We discussed management including behavioral therapy (decreasing bladder irritants by following a bladder diet, urge suppression strategies, timed voids, bladder retraining), physical therapy, medication; and for refractory cases posterior tibial nerve stimulation, sacral neuromodulation, and intravesical botulinum toxin injection.   For Beta-3 agonist medication, we discussed the potential side effect of elevated blood pressure which is more likely to occur in individuals with uncontrolled hypertension. You were given Myrbetriq 25 mg.  It can take a month to start working so give it time, but if you have bothersome side effects call sooner and we can try a different medication.  Call us if you have trouble filling the prescription or if it's not covered by your insurance.   

## 2021-10-09 DIAGNOSIS — R7303 Prediabetes: Secondary | ICD-10-CM | POA: Diagnosis not present

## 2021-10-09 DIAGNOSIS — E669 Obesity, unspecified: Secondary | ICD-10-CM | POA: Diagnosis not present

## 2021-10-09 DIAGNOSIS — Z6841 Body Mass Index (BMI) 40.0 and over, adult: Secondary | ICD-10-CM | POA: Diagnosis not present

## 2021-10-09 DIAGNOSIS — E785 Hyperlipidemia, unspecified: Secondary | ICD-10-CM | POA: Diagnosis not present

## 2021-10-29 DIAGNOSIS — M25512 Pain in left shoulder: Secondary | ICD-10-CM | POA: Diagnosis not present

## 2021-10-30 NOTE — Telephone Encounter (Signed)
1st no show, fee waived KO

## 2021-11-01 ENCOUNTER — Ambulatory Visit (INDEPENDENT_AMBULATORY_CARE_PROVIDER_SITE_OTHER): Payer: BC Managed Care – PPO

## 2021-11-01 ENCOUNTER — Encounter: Payer: Self-pay | Admitting: Podiatry

## 2021-11-01 ENCOUNTER — Ambulatory Visit: Payer: BC Managed Care – PPO | Admitting: Podiatry

## 2021-11-01 DIAGNOSIS — M722 Plantar fascial fibromatosis: Secondary | ICD-10-CM

## 2021-11-01 MED ORDER — TRIAMCINOLONE ACETONIDE 10 MG/ML IJ SUSP
10.0000 mg | Freq: Once | INTRAMUSCULAR | Status: AC
  Administered 2021-11-01: 10 mg

## 2021-11-01 NOTE — Progress Notes (Signed)
Subjective:   Patient ID: Barbara Guerrero, female   DOB: 66 y.o.   MRN: [id number]   HPI Patient states she has developed severe discomfort in the right heel times the last month and she gets pain in her forefoot bilateral low-grade nature and was concerned about nail growth.  States that heels been very sore difficult to walk worse when she gets up in the morning after sitting and patient does not smoke likes to be active   Review of Systems  All other systems reviewed and are negative.       Objective:  Physical Exam Vitals and nursing note reviewed.  Constitutional:      Appearance: She is well-developed.  Pulmonary:     Effort: Pulmonary effort is normal.  Musculoskeletal:        General: Normal range of motion.  Skin:    General: Skin is warm.  Neurological:     Mental Status: She is alert.     Neurovascular status intact muscle strength adequate range of motion within normal limits with patient found to have exquisite discomfort right plantar fascia at the insertional point of the tendon into the calcaneus with inflammation fluid in the medial band.  Moderate flatfoot deformity mild forefoot discomfort bilateral which may be compensatory with digital deformity second right over left elongation with minimal discomfort     Assessment:  Acute plantar fasciitis right along with forefoot discomfort bilateral low-grade and digital elongation deformity     Plan:  H&P reviewed all conditions and x-rays and today did sterile prep injected the right plantar fascia 3 mg Kenalog 5 mg Xylocaine at insertion applied fascial brace gave instructions on physical therapy and supportive shoe gear usage and patient will be seen back 2 weeks may require orthotics  X-rays indicate moderate depression of the arch bilaterally elongated second digit bilateral spur plantar heel right over left with flatfoot deformity bilateral

## 2021-11-01 NOTE — Patient Instructions (Signed)

## 2021-11-06 ENCOUNTER — Ambulatory Visit: Payer: BC Managed Care – PPO | Admitting: Obstetrics and Gynecology

## 2021-11-07 ENCOUNTER — Ambulatory Visit
Admission: EM | Admit: 2021-11-07 | Discharge: 2021-11-07 | Disposition: A | Discharge status: Home / Self Care | Payer: BC Managed Care – PPO | Attending: Urgent Care | Admitting: Urgent Care

## 2021-11-07 ENCOUNTER — Encounter: Payer: Self-pay | Admitting: Emergency Medicine

## 2021-11-07 DIAGNOSIS — R002 Palpitations: Secondary | ICD-10-CM

## 2021-11-07 DIAGNOSIS — R0789 Other chest pain: Secondary | ICD-10-CM

## 2021-11-07 MED ORDER — FAMOTIDINE 20 MG PO TABS: 20.0000 mg | ORAL_TABLET | Freq: Two times a day (BID) | ORAL | 0 refills | Status: AC

## 2021-11-07 NOTE — ED Triage Notes (Signed)
Pt has had intermittent palpitations for about a month along with discomfort while swallowing and states she feels pain in her back when she does.

## 2021-11-07 NOTE — ED Provider Notes (Addendum)
[place]   MRN: [id number] DOB: [birth year]  Subjective:   Barbara Guerrero is a 66 y.o. female presenting for 1 month history of persistent heart fluttering sensation in the midsternal with discomfort.  Symptoms can happen after she eats but feels that it could be in her back as well.  No history of heart disease, diabetes or smoking.  She does have a history of high blood pressure and is managed for this with medical therapy.  Has not had to see a heart doctor before.  No cough, shortness of breath, fevers, body aches.  No current facility-administered medications for this encounter.  Current Outpatient Medications:    acetaminophen (TYLENOL) 500 MG tablet, Take by mouth., Disp: , Rfl:    albuterol (VENTOLIN HFA) 108 (90 Base) MCG/ACT inhaler, Inhale 2 puffs into the lungs every 6 (six) hours as needed for wheezing or shortness of breath., Disp: 18 g, Rfl: 1   Cyanocobalamin (VITAMIN B 12 PO), Take 1 tablet by mouth daily., Disp: , Rfl:    diphenhydrAMINE (BENADRYL) 25 MG tablet, Take 25 mg by mouth every 6 (six) hours as needed., Disp: , Rfl:    EPINEPHrine (EPIPEN 2-PAK) 0.3 mg/0.3 mL IJ SOAJ injection, EpiPen 2-Pak 0.3 mg/0.3 mL injection, auto-injector, Disp: 2 each, Rfl: 3   fluticasone (FLONASE) 50 MCG/ACT nasal spray, Place 2 sprays into both nostrils daily., Disp: 16 g, Rfl: 2   furosemide (LASIX) 40 MG tablet, Take 1 tablet (40 mg total) by mouth daily., Disp: 5 tablet, Rfl: 0   VENTOLIN HFA 108 (90 Base) MCG/ACT inhaler, Inhale 2 puffs into the lungs every 6 (six) hours as needed for wheezing or shortness of breath., Disp: 18 g, Rfl: 1   Vibegron (GEMTESA) 75 MG TABS, Take 75 mg by mouth daily., Disp: 30 tablet, Rfl: 5   Allergies  Allergen Reactions   Shellfish Allergy Itching    Past Medical History:  Diagnosis Date   Amenorrhea    Asthma    Barium enema abnormal    Breast cyst    Family history of breast cancer in first degree relative     Fibroids    Hypertension    Lactose intolerance    Mastodynia    Pelvic pain in female    Rectal fissure      Past Surgical History:  Procedure Laterality Date   BRAIN SURGERY  2003   [place]  1980/1985   DIAGNOSTIC LAPAROSCOPY      Family History  Problem Relation Age of Onset   Hypertension Mother    Cancer Sister        breast cancer    Social History   Tobacco Use   Smoking status: Never   Smokeless tobacco: Never  Vaping Use   Vaping Use: Never used  Substance Use Topics   Alcohol use: No   Drug use: No    ROS   Objective:   Vitals: BP 122/78   Pulse 89   Temp 98.2 F (36.8 C)   Resp 20   SpO2 98%   Physical Exam Constitutional:      General: She is not in acute distress.    Appearance: Normal appearance. She is well-developed. She is not ill-appearing, toxic-appearing or diaphoretic.  HENT:     Head: Normocephalic and atraumatic.     Nose: Nose normal.     Mouth/Throat:     Mouth: Mucous membranes are moist.  Eyes:  General: No scleral icterus.       Right eye: No discharge.        Left eye: No discharge.     Extraocular Movements: Extraocular movements intact.  Cardiovascular:     Rate and Rhythm: Normal rate and regular rhythm.     Heart sounds: Murmur (soft systolic ejection murmur best heard over LUSB) heard.     No friction rub. No gallop.  Pulmonary:     Effort: Pulmonary effort is normal. No respiratory distress.     Breath sounds: No stridor. No wheezing, rhonchi or rales.  Chest:     Chest wall: No tenderness.  Skin:    General: Skin is warm and dry.  Neurological:     General: No focal deficit present.     Mental Status: She is alert and oriented to person, place, and time.  Psychiatric:        Mood and Affect: Mood normal.        Behavior: Behavior normal.     ED ECG REPORT   Date: 11/07/2021  EKG Time: 2:47 PM  Rate: 87bpm  Rhythm: normal sinus rhythm,  there are no previous  tracings available for comparison  Axis: left  Intervals:none  ST&T Change: None  Narrative Interpretation: Sinus rhythm at 87 bpm with minimal voltage criteria for LVH.  No previous EKG available for comparison.   Assessment and Plan :   PDMP not reviewed this encounter.  1. Fluttering sensation of heart   2. Chest discomfort    No signs of ACS including lack of chest pain in clinic.  Recommended consultation with cardiologist.  In the meantime patient can start baby aspirin, famotidine.  Advised on healthy diet.  Maintain all regular medications. Counseled patient on potential for adverse effects with medications prescribed/recommended today, ER and return-to-clinic precautions discussed, patient verbalized understanding.      Jaynee Eagles, PA-C 11/07/21 1505   Patient return to our clinic and requested that we remove any referrals to cardiology as she is going to pursue her own referral through her PCP.    Jaynee Eagles, [place] 11/07/21 1531

## 2021-11-07 NOTE — Discharge Instructions (Addendum)
We have requested that we discontinue a cardiology referral.  Please follow-up with your regular doctor to pursue a cardiology referral through them.

## 2021-11-19 ENCOUNTER — Ambulatory Visit: Payer: BC Managed Care – PPO | Admitting: Podiatry

## 2021-12-05 ENCOUNTER — Ambulatory Visit: Payer: BC Managed Care – PPO | Admitting: Obstetrics and Gynecology

## 2021-12-25 DIAGNOSIS — M7542 Impingement syndrome of left shoulder: Secondary | ICD-10-CM | POA: Diagnosis not present

## 2021-12-25 DIAGNOSIS — M7502 Adhesive capsulitis of left shoulder: Secondary | ICD-10-CM | POA: Diagnosis not present

## 2022-01-08 DIAGNOSIS — M7542 Impingement syndrome of left shoulder: Secondary | ICD-10-CM | POA: Diagnosis not present

## 2022-01-08 DIAGNOSIS — M7502 Adhesive capsulitis of left shoulder: Secondary | ICD-10-CM | POA: Diagnosis not present

## 2022-01-15 DIAGNOSIS — M7542 Impingement syndrome of left shoulder: Secondary | ICD-10-CM | POA: Diagnosis not present

## 2022-01-15 DIAGNOSIS — M7502 Adhesive capsulitis of left shoulder: Secondary | ICD-10-CM | POA: Diagnosis not present

## 2022-01-22 DIAGNOSIS — M7502 Adhesive capsulitis of left shoulder: Secondary | ICD-10-CM | POA: Diagnosis not present

## 2022-01-22 DIAGNOSIS — M7542 Impingement syndrome of left shoulder: Secondary | ICD-10-CM | POA: Diagnosis not present

## 2022-02-01 DIAGNOSIS — U071 COVID-19: Secondary | ICD-10-CM | POA: Diagnosis not present

## 2022-02-01 DIAGNOSIS — J069 Acute upper respiratory infection, unspecified: Secondary | ICD-10-CM | POA: Diagnosis not present

## 2022-02-13 ENCOUNTER — Ambulatory Visit: Payer: BC Managed Care – PPO | Admitting: Podiatry

## 2022-02-13 ENCOUNTER — Encounter: Payer: Self-pay | Admitting: Podiatry

## 2022-02-13 DIAGNOSIS — M722 Plantar fascial fibromatosis: Secondary | ICD-10-CM | POA: Diagnosis not present

## 2022-02-13 MED ORDER — TRIAMCINOLONE ACETONIDE 10 MG/ML IJ SUSP: 10.0000 mg | Freq: Once | INTRAMUSCULAR | Status: AC

## 2022-02-13 NOTE — Progress Notes (Signed)
Subjective:   Patient ID: Barbara Guerrero, female   DOB: 66 y.o.   MRN: [id number]   HPI Patient states she is having a significant increase in pain again in the right plantar heel making it hard to walk   ROS      Objective:  Physical Exam  Neurovascular status intact intense discomfort medial fascial band right at the insertional point of the tendon calcaneus     Assessment:  Acute plantar fasciitis right still present with flatfoot deformity     Plan:  H&P reviewed condition and the reoccurrence of discomfort.  I went ahead today did do sterile prep and injected the plantar fascia right 3 mg Kenalog 5 mg Xylocaine and then went ahead and casted her for functional orthotics due to foot structural issues moderate obesity and patient's motivation to be more active

## 2022-03-05 DIAGNOSIS — H1031 Unspecified acute conjunctivitis, right eye: Secondary | ICD-10-CM | POA: Diagnosis not present

## 2022-03-06 ENCOUNTER — Encounter: Payer: Self-pay | Admitting: Podiatry

## 2022-03-06 ENCOUNTER — Telehealth: Payer: Self-pay | Admitting: Podiatry

## 2022-03-06 NOTE — Telephone Encounter (Signed)
Number says not in service trying to reach pt to schedule appt to pick up orthotics - no balance , Sent letter

## 2022-03-11 ENCOUNTER — Telehealth: Payer: Self-pay | Admitting: Podiatry

## 2022-03-11 NOTE — Telephone Encounter (Signed)
2nd call to reach patient for orthotics , number not in service .

## 2022-03-27 ENCOUNTER — Telehealth: Payer: Self-pay | Admitting: Family Medicine

## 2022-03-27 NOTE — Telephone Encounter (Addendum)
Patient is requesting to be a new pt of Dr. Volanda Napoleon.  [name], one of your patients, stated she had talked to you at her last visit and you had stated that it would be ok to take Barbara Guerrero as a new patient.  [name] is April's mother.    Is this ok to take as a new patient?  (Note for Tammy- call [name] back to make the appointment per Marty Guerrero)   [phone]

## 2022-04-03 NOTE — Telephone Encounter (Signed)
Ok

## 2022-04-23 NOTE — Telephone Encounter (Signed)
3rd attempt to reach patient , no answer no vm  letter was sent on 11/22

## 2022-04-24 NOTE — Telephone Encounter (Signed)
Called April back, VM full.

## 2022-04-29 NOTE — Telephone Encounter (Signed)
Scheduled new pt appt with daughter April for patient//tes

## 2022-05-30 DIAGNOSIS — J4520 Mild intermittent asthma, uncomplicated: Secondary | ICD-10-CM | POA: Diagnosis not present

## 2022-05-30 DIAGNOSIS — J3081 Allergic rhinitis due to animal (cat) (dog) hair and dander: Secondary | ICD-10-CM | POA: Diagnosis not present

## 2022-05-30 DIAGNOSIS — J301 Allergic rhinitis due to pollen: Secondary | ICD-10-CM | POA: Diagnosis not present

## 2022-05-30 DIAGNOSIS — J3089 Other allergic rhinitis: Secondary | ICD-10-CM | POA: Diagnosis not present

## 2022-05-30 DIAGNOSIS — J45998 Other asthma: Secondary | ICD-10-CM | POA: Diagnosis not present

## 2022-06-03 NOTE — Telephone Encounter (Signed)
Tried to reach patient again for her orthotics number no in service .

## 2022-07-30 DIAGNOSIS — M25561 Pain in right knee: Secondary | ICD-10-CM | POA: Diagnosis not present

## 2022-07-30 DIAGNOSIS — M545 Low back pain, unspecified: Secondary | ICD-10-CM | POA: Diagnosis not present

## 2022-07-30 DIAGNOSIS — M25562 Pain in left knee: Secondary | ICD-10-CM | POA: Diagnosis not present

## 2022-07-31 DIAGNOSIS — M170 Bilateral primary osteoarthritis of knee: Secondary | ICD-10-CM | POA: Diagnosis not present

## 2022-07-31 DIAGNOSIS — S39012D Strain of muscle, fascia and tendon of lower back, subsequent encounter: Secondary | ICD-10-CM | POA: Diagnosis not present

## 2022-08-08 DIAGNOSIS — M170 Bilateral primary osteoarthritis of knee: Secondary | ICD-10-CM | POA: Diagnosis not present

## 2022-08-08 DIAGNOSIS — S39012D Strain of muscle, fascia and tendon of lower back, subsequent encounter: Secondary | ICD-10-CM | POA: Diagnosis not present

## 2022-08-28 DIAGNOSIS — S39012D Strain of muscle, fascia and tendon of lower back, subsequent encounter: Secondary | ICD-10-CM | POA: Diagnosis not present

## 2022-08-28 DIAGNOSIS — M170 Bilateral primary osteoarthritis of knee: Secondary | ICD-10-CM | POA: Diagnosis not present

## 2022-09-18 DIAGNOSIS — M170 Bilateral primary osteoarthritis of knee: Secondary | ICD-10-CM | POA: Diagnosis not present

## 2022-09-25 ENCOUNTER — Ambulatory Visit: Payer: BC Managed Care – PPO | Admitting: Family Medicine

## 2022-10-08 DIAGNOSIS — S39012D Strain of muscle, fascia and tendon of lower back, subsequent encounter: Secondary | ICD-10-CM | POA: Diagnosis not present

## 2022-10-08 DIAGNOSIS — M170 Bilateral primary osteoarthritis of knee: Secondary | ICD-10-CM | POA: Diagnosis not present

## 2022-10-16 DIAGNOSIS — M170 Bilateral primary osteoarthritis of knee: Secondary | ICD-10-CM | POA: Diagnosis not present

## 2022-10-16 DIAGNOSIS — S39012D Strain of muscle, fascia and tendon of lower back, subsequent encounter: Secondary | ICD-10-CM | POA: Diagnosis not present

## 2022-11-21 ENCOUNTER — Ambulatory Visit: Payer: BC Managed Care – PPO | Admitting: Family Medicine

## 2022-11-21 ENCOUNTER — Encounter: Payer: Self-pay | Admitting: Family Medicine

## 2022-11-21 VITALS — BP 134/78 | HR 85 | Temp 98.5°F | Ht 64.5 in | Wt 242.2 lb

## 2022-11-21 DIAGNOSIS — R002 Palpitations: Secondary | ICD-10-CM | POA: Diagnosis not present

## 2022-11-21 DIAGNOSIS — M1990 Unspecified osteoarthritis, unspecified site: Secondary | ICD-10-CM

## 2022-11-21 DIAGNOSIS — Z6841 Body Mass Index (BMI) 40.0 and over, adult: Secondary | ICD-10-CM

## 2022-11-21 DIAGNOSIS — Z8679 Personal history of other diseases of the circulatory system: Secondary | ICD-10-CM

## 2022-11-21 DIAGNOSIS — Z91013 Allergy to seafood: Secondary | ICD-10-CM

## 2022-11-21 DIAGNOSIS — R32 Unspecified urinary incontinence: Secondary | ICD-10-CM

## 2022-11-21 DIAGNOSIS — R1310 Dysphagia, unspecified: Secondary | ICD-10-CM | POA: Diagnosis not present

## 2022-11-21 DIAGNOSIS — R011 Cardiac murmur, unspecified: Secondary | ICD-10-CM | POA: Diagnosis not present

## 2022-11-21 DIAGNOSIS — K219 Gastro-esophageal reflux disease without esophagitis: Secondary | ICD-10-CM

## 2022-11-21 DIAGNOSIS — Z7689 Persons encountering health services in other specified circumstances: Secondary | ICD-10-CM

## 2022-11-21 LAB — CBC WITH DIFFERENTIAL/PLATELET
Basophils Absolute: 0.1 10*3/uL (ref 0.0–0.1)
Basophils Relative: 0.9 % (ref 0.0–3.0)
Eosinophils Absolute: 0.2 10*3/uL (ref 0.0–0.7)
Eosinophils Relative: 2.7 % (ref 0.0–5.0)
HCT: 43.8 % (ref 36.0–46.0)
Hemoglobin: 14 g/dL (ref 12.0–15.0)
Lymphocytes Relative: 28.5 % (ref 12.0–46.0)
Lymphs Abs: 2.1 10*3/uL (ref 0.7–4.0)
MCHC: 32 g/dL (ref 30.0–36.0)
MCV: 92.6 fl (ref 78.0–100.0)
Monocytes Absolute: 0.9 10*3/uL (ref 0.1–1.0)
Monocytes Relative: 12.4 % — ABNORMAL HIGH (ref 3.0–12.0)
Neutro Abs: 4.1 10*3/uL (ref 1.4–7.7)
Neutrophils Relative %: 55.5 % (ref 43.0–77.0)
Platelets: 274 10*3/uL (ref 150.0–400.0)
RBC: 4.73 Mil/uL (ref 3.87–5.11)
RDW: 13.9 % (ref 11.5–15.5)
WBC: 7.5 10*3/uL (ref 4.0–10.5)

## 2022-11-21 LAB — COMPREHENSIVE METABOLIC PANEL
ALT: 41 U/L — ABNORMAL HIGH (ref 0–35)
AST: 38 U/L — ABNORMAL HIGH (ref 0–37)
Albumin: 3.8 g/dL (ref 3.5–5.2)
Alkaline Phosphatase: 78 U/L (ref 39–117)
BUN: 18 mg/dL (ref 6–23)
CO2: 32 mEq/L (ref 19–32)
Calcium: 9.5 mg/dL (ref 8.4–10.5)
Chloride: 101 mEq/L (ref 96–112)
Creatinine, Ser: 0.95 mg/dL (ref 0.40–1.20)
GFR: 62.76 mL/min (ref >60.00)
Glucose, Bld: 103 mg/dL — ABNORMAL HIGH (ref 70–99)
Potassium: 3.5 mEq/L (ref 3.5–5.1)
Sodium: 140 mEq/L (ref 135–145)
Total Bilirubin: 0.3 mg/dL (ref 0.2–1.2)
Total Protein: 7.5 g/dL (ref 6.0–8.3)

## 2022-11-21 LAB — LIPID PANEL
Cholesterol: 235 mg/dL — ABNORMAL HIGH (ref 0–200)
HDL: 50.1 mg/dL (ref >39.00)
NonHDL: 185.11
Total CHOL/HDL Ratio: 5
Triglycerides: 205 mg/dL — ABNORMAL HIGH (ref 0.0–149.0)
VLDL: 41 mg/dL — ABNORMAL HIGH (ref 0.0–40.0)

## 2022-11-21 LAB — HEMOGLOBIN A1C: Hgb A1c MFr Bld: 6 % (ref 4.6–6.5)

## 2022-11-21 LAB — LDL CHOLESTEROL, DIRECT: Direct LDL: 178 mg/dL

## 2022-11-21 LAB — TSH: TSH: 0.67 u[IU]/mL (ref 0.35–5.50)

## 2022-11-21 LAB — T4, FREE: Free T4: 0.8 ng/dL (ref 0.60–1.60)

## 2022-11-21 NOTE — Patient Instructions (Addendum)
It was nice meeting you today.  A referral to the Cardiologist and to the Gastroenterologist were placed.  They will contact you in the next few wks to set up an appointment.

## 2022-11-21 NOTE — Progress Notes (Signed)
Established Patient Office Visit   Subjective  Patient ID: Barbara Guerrero, female    DOB: 1955/06/27  Age: 67 y.o. MRN: [id number]  Chief Complaint  Patient presents with   Establish Care    Circulation in hands. States hands feel tight (swelling) all the time and uses the RR a lot.  Would like referral to cardiologist.     Patient is a 67 year old female previously seen by Dr. Pete Guerrero who presents to establish care and follow-up on ongoing concerns.  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  Taking diclofenac daily for the last 3 to 4 months.  Seen by Barbara Guerrero, Dr. Farris Guerrero who is retiring.  Dr. Madelon Guerrero did pt's R knee surgery.  She Guerrero not decided who she would like to continue her care with.  Heartburn: May have symptoms every now and then.  Once a month or once a week.  Will take OTC Tums which provides relief.  Dysphagia: Patient notes the sensation of bread and chicken getting stuck in throat after swallowing.  Will have to drink water to get food to go down.  May have started before COVID.  Becoming progressively worse.  States had a negative barium swallow test several years ago.  Patient thinks she had a colonoscopy around age 55 but Guerrero not had an EGD.  Palpitations: Notes intermittent "flutters".  May last seconds.  Denies chest discomfort, SOB, caffeine intake.  Eats chocolate once or twice per week.  Urinary incontinence: Ongoing times years.  Endorses urinary leakage.  Having to wear pads.  Previously given [place] but states she did not feel good on it.  Patient unsure if it helped urinary symptoms.  History of hypertension?:  Patient states she was on cardia XT in the past.  States Guerrero been off medication for "a while".  Not currently checking BP.  Was also on Lasix in the past.  Allergies: NKDA Shellfish-had wheezing.  Does not recall other symptoms.  States had allergy shots.  Was told she could eat seafood if she had her EpiPen and inhaler.  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  Patient Guerrero 2 children.  Pt's daughter, Barbara Guerrero, seen by this provider.  Patient denies alcohol, tobacco, drug use.  Health maintenance: Last mammogram 2019 Last eye exam 2022 Last foot exam 2023     Past Medical History:  Diagnosis Date   Amenorrhea    Asthma    Barium enema abnormal    Breast cyst    Family history of breast cancer in first degree relative    Fibroids    Hypertension    Lactose intolerance    Mastodynia    Pelvic pain in female    Rectal fissure    Past Surgical History:  Procedure Laterality Date   BRAIN SURGERY  2003   BREAST SURGERY  1977   CESAREAN SECTION  1980/1985   DIAGNOSTIC LAPAROSCOPY     Social History   Tobacco Use   Smoking status: Never   Smokeless tobacco: Never  Vaping Use   Vaping status: Never Used  Substance Use Topics   Alcohol use: No   Drug use: No   Family History  Problem Relation Age of Onset   Hypertension Mother    Cancer Sister        breast cancer   Allergies  Allergen Reactions   Shellfish Allergy Itching      ROS Negative unless stated above    Objective:  BP 134/78 (BP Location: Left Arm, Patient Position: Sitting, Cuff Size: Large)   Pulse 85   Temp 98.5 F (36.9 C) (Oral)   Ht 5' 4.5" (1.638 m)   Wt 242 lb 3.2 oz (109.9 kg)   SpO2 95%   BMI 40.93 kg/m  BP Readings from Last 3 Encounters:  11/21/22 134/78  11/07/21 122/78  09/18/21 117/81   Wt Readings from Last 3 Encounters:  11/21/22 242 lb 3.2 oz (109.9 kg)  09/18/21 237 lb (107.5 kg)  02/27/21 252 lb 3.2 oz (114.4 kg)      Physical Exam Constitutional:      General: She is not in acute distress.    Appearance: Normal appearance.  HENT:     Head: Normocephalic and atraumatic.     Nose: Nose normal.     Mouth/Throat:     Mouth: Mucous membranes are moist.  Eyes:     Conjunctiva/sclera: Conjunctivae normal.  Neck:     Vascular: No carotid bruit.   Cardiovascular:     Rate and Rhythm: Normal rate and regular rhythm.     Heart sounds: Murmur heard.     No gallop.  Pulmonary:     Effort: Pulmonary effort is normal. No respiratory distress.     Breath sounds: Normal breath sounds. No wheezing, rhonchi or rales.  Abdominal:     General: Bowel sounds are normal.     Palpations: Abdomen is soft.  Skin:    General: Skin is warm and dry.  Neurological:     Mental Status: She is alert and oriented to person, place, and time.      No results found for any visits on 11/21/22.    Assessment & Plan:  Gastroesophageal reflux disease, unspecified whether esophagitis present -Intermittent symptoms.  Continue to control with diet and OTC medications such as Tums as needed -Discussed foods known to cause symptoms.  Avoidance advised. -     Comprehensive metabolic panel -     Ambulatory referral to Gastroenterology  Dysphagia, unspecified type -Barium swallow in the past negative per pt -Given ongoing symptoms discussed seeing GI for EGD -Discussed supportive care including cutting up food into smaller pieces, chewing food well, and drinking plenty of water/fluids while eating. -     Ambulatory referral to Gastroenterology  Palpitations -Not appreciated on exam.  EKG not obtained. -Discussed possible causes of symptoms. -Will obtain labs to evaluate thyroid dysfunction and electrolyte abnormality. -Refer to cardiology for further evaluation -     Comprehensive metabolic panel -     TSH -     T4, free -     CBC with Differential/Platelet -     Ambulatory referral to Cardiology  Murmur -Discussed possible causes -Refer to cardiology for echo and further eval. -     CBC with Differential/Platelet -     Ambulatory referral to Cardiology  Shellfish allergy -Avoidance  Class 3 severe obesity with serious comorbidity and body mass index (BMI) of 40.0 to 44.9 in adult, unspecified obesity type (HCC) -Body mass index is 40.93  kg/m. -Lifestyle modifications encouraged -     Hemoglobin A1c -     Lipid panel  Urinary incontinence, unspecified type -     Comprehensive metabolic panel  Arthritis -Continue follow-up with Ortho -Use diclofenac daily with caution.  Take medication with food.  Encounter to establish care -We reviewed the PMH, PSH, FH, SH, Meds and Allergies. -We provided refills for any medications we will prescribe as needed. -We  addressed current concerns per orders and patient instructions. -We have asked for records for pertinent exams, studies, vaccines and notes from previous providers. -We have advised patient to follow up per instructions below.  History of hypertension -Continue to monitor BP -Restart medication if needed for BP elevations consistently greater than 140/90. -     Comprehensive metabolic panel -     TSH -     T4, free -     Lipid panel -     Ambulatory referral to Cardiology   Return in about 5 weeks (around 12/26/2022), or if symptoms worsen or fail to improve.   Deeann Saint, MD

## 2022-11-26 ENCOUNTER — Telehealth: Payer: Self-pay | Admitting: Family Medicine

## 2022-11-26 NOTE — Telephone Encounter (Signed)
Returned call for results 

## 2023-01-23 NOTE — Progress Notes (Deleted)
Cardiology Office Note:    Date:  01/23/2023   ID:  Barbara, Guerrero 1955-11-17, MRN [id number]  PCP:  Deeann Saint, MD   [facility] Providers Cardiologist:  None { Click to update primary MD,subspecialty MD or APP then REFRESH:1}    Referring MD: Deeann Saint, MD   No chief complaint on file. ***  History of Present Illness:    Barbara Guerrero is a 67 y.o. female seen at the request of Dr Salomon Fick for evaluation of murmur and palpitations. She has a history of HTN and HLD.    Past Medical History:  Diagnosis Date   Amenorrhea    Asthma    Barium enema abnormal    Breast cyst    Family history of breast cancer in first degree relative    Fibroids    Hypertension    Lactose intolerance    Mastodynia    Pelvic pain in female    Rectal fissure     Past Surgical History:  Procedure Laterality Date   BRAIN SURGERY  2003   BREAST SURGERY  1977   CESAREAN SECTION  1980/1985   DIAGNOSTIC LAPAROSCOPY      Current Medications: No outpatient medications have been marked as taking for the 01/28/23 encounter (Appointment) with [place], Sarthak Rubenstein M, MD.   Current Facility-Administered Medications for the 01/28/23 encounter (Appointment) with [place], Timeka Goette M, MD  Medication   triamcinolone acetonide (KENALOG) 10 MG/ML injection 10 mg     Allergies:   Shellfish allergy   Social History   Socioeconomic History   Marital status: Married    Spouse name: Not on file   Number of children: Not on file   Years of education: Not on file   Highest education level: Not on file  Occupational History   Not on file  Tobacco Use   Smoking status: Never   Smokeless tobacco: Never  Vaping Use   Vaping status: Never Used  Substance and Sexual Activity   Alcohol use: No   Drug use: No   Sexual activity: Yes    Birth control/protection: Post-menopausal  Other Topics Concern   Not on file  Social History Narrative   Not on file   Social Determinants of  Health   Financial Resource Strain: Not on file  Food Insecurity: Not on file  Transportation Needs: Not on file  Physical Activity: Not on file  Stress: Not on file  Social Connections: Not on file     Family History: The patient's ***family history includes Cancer in her sister; Hypertension in her mother.  ROS:   Please see the history of present illness.    *** All other systems reviewed and are negative.  EKGs/Labs/Other Studies Reviewed:    The following studies were reviewed today: ***      Recent Labs: 11/21/2022: ALT 41; BUN 18; Creatinine, Ser 0.95; Hemoglobin 14.0; Platelets 274.0; Potassium 3.5; Sodium 140; TSH 0.67  Recent Lipid Panel    Component Value Date/Time   CHOL 235 (H) 11/21/2022 1147   TRIG 205.0 (H) 11/21/2022 1147   HDL 50.10 11/21/2022 1147   CHOLHDL 5 11/21/2022 1147   VLDL 41.0 (H) 11/21/2022 1147   LDLDIRECT 178.0 11/21/2022 1147     Risk Assessment/Calculations:   {Does this patient have ATRIAL FIBRILLATION?[phone]}  No BP recorded.  {Refresh Note OR Click here to enter BP  :1}***         Physical Exam:    VS:  There  were no vitals taken for this visit.    Wt Readings from Last 3 Encounters:  11/21/22 242 lb 3.2 oz (109.9 kg)  09/18/21 237 lb (107.5 kg)  02/27/21 252 lb 3.2 oz (114.4 kg)     GEN: *** Well nourished, well developed in no acute distress HEENT: Normal NECK: No JVD; No carotid bruits LYMPHATICS: No lymphadenopathy CARDIAC: ***RRR, no murmurs, rubs, gallops RESPIRATORY:  Clear to auscultation without rales, wheezing or rhonchi  ABDOMEN: Soft, non-tender, non-distended MUSCULOSKELETAL:  No edema; No deformity  SKIN: Warm and dry NEUROLOGIC:  Alert and oriented x 3 PSYCHIATRIC:  Normal affect   ASSESSMENT:    No diagnosis found. PLAN:    In order of problems listed above:  ***      {Are you ordering a CV Procedure (e.g. stress test, cath, DCCV, TEE, etc)?   Press F2        :[id number]}     Medication Adjustments/Labs and Tests Ordered: Current medicines are reviewed at length with the patient today.  Concerns regarding medicines are outlined above.  No orders of the defined types were placed in this encounter.  No orders of the defined types were placed in this encounter.   There are no Patient Instructions on file for this visit.   Signed, Yonah Tangeman [place], MD  01/23/2023 1:25 PM    [facility]

## 2023-01-28 ENCOUNTER — Ambulatory Visit: Payer: BC Managed Care – PPO | Admitting: Cardiology

## 2023-02-05 DIAGNOSIS — J301 Allergic rhinitis due to pollen: Secondary | ICD-10-CM | POA: Diagnosis not present

## 2023-02-05 DIAGNOSIS — J3089 Other allergic rhinitis: Secondary | ICD-10-CM | POA: Diagnosis not present

## 2023-02-05 DIAGNOSIS — J45998 Other asthma: Secondary | ICD-10-CM | POA: Diagnosis not present

## 2023-02-05 DIAGNOSIS — J3081 Allergic rhinitis due to animal (cat) (dog) hair and dander: Secondary | ICD-10-CM | POA: Diagnosis not present

## 2023-02-05 DIAGNOSIS — J4520 Mild intermittent asthma, uncomplicated: Secondary | ICD-10-CM | POA: Diagnosis not present

## 2023-02-07 ENCOUNTER — Telehealth: Payer: Self-pay | Admitting: Family Medicine

## 2023-02-07 ENCOUNTER — Other Ambulatory Visit: Payer: Self-pay | Admitting: Family Medicine

## 2023-02-07 DIAGNOSIS — Z91013 Allergy to seafood: Secondary | ICD-10-CM | POA: Diagnosis not present

## 2023-02-07 DIAGNOSIS — Z1231 Encounter for screening mammogram for malignant neoplasm of breast: Secondary | ICD-10-CM

## 2023-02-07 NOTE — Telephone Encounter (Signed)
Patient came in and would like a referral to:  Dr. Isabell Jarvis [facility] Phone: [phone] Fax: [phone]  Please put the patients whole name on the referral:  Barbara Guerrero  Patient stated she wants to come pick this up to have for her appointment, please advise pt at [phone]

## 2023-02-07 NOTE — Telephone Encounter (Signed)
Called patient to ask why referral is need, left a VM to return my call

## 2023-02-10 DIAGNOSIS — J301 Allergic rhinitis due to pollen: Secondary | ICD-10-CM | POA: Diagnosis not present

## 2023-02-10 DIAGNOSIS — J3089 Other allergic rhinitis: Secondary | ICD-10-CM | POA: Diagnosis not present

## 2023-02-10 DIAGNOSIS — J4520 Mild intermittent asthma, uncomplicated: Secondary | ICD-10-CM | POA: Diagnosis not present

## 2023-02-10 DIAGNOSIS — J3081 Allergic rhinitis due to animal (cat) (dog) hair and dander: Secondary | ICD-10-CM | POA: Diagnosis not present

## 2023-02-10 NOTE — Telephone Encounter (Signed)
Patient came in stating that she missed the appointment for the original referral and decided to go with this facility instead, patient states she needs a paper referral as she does not want to rely on her cell phone for this information.  Patient did receive a text message from the practice, but she has trouble with clicking on messages, as she has had problems in the past.  Please advise patient on referral process [phone].

## 2023-02-11 NOTE — Telephone Encounter (Signed)
Called patient left a VM to return call

## 2023-02-18 DIAGNOSIS — J301 Allergic rhinitis due to pollen: Secondary | ICD-10-CM | POA: Diagnosis not present

## 2023-02-18 DIAGNOSIS — J3081 Allergic rhinitis due to animal (cat) (dog) hair and dander: Secondary | ICD-10-CM | POA: Diagnosis not present

## 2023-02-19 DIAGNOSIS — J3089 Other allergic rhinitis: Secondary | ICD-10-CM | POA: Diagnosis not present

## 2023-02-20 ENCOUNTER — Telehealth: Payer: Self-pay | Admitting: Family Medicine

## 2023-02-20 NOTE — Telephone Encounter (Signed)
Called patient left a voicemail to find out where the patient is going for the referral, so I can speak with Barbara Guerrero

## 2023-02-20 NOTE — Telephone Encounter (Signed)
Patient called requesting notes printed regarding her referral.  Patient will come to pick this up 02/21/2023  Please advise at [phone]

## 2023-02-28 NOTE — Telephone Encounter (Signed)
Pt requesting Dr. Isabell Jarvis [facility] Phone: [phone] Fax: [phone]   Please put the patients whole name on the referral:   Barbara Guerrero   Patient stated she wants to come pick this up to have for her appointment, please advise pt at [phone] to advise of where we are in this process.

## 2023-03-03 NOTE — Telephone Encounter (Signed)
The original referral to [facility] looks like it was authorized in the computer.  Patient can contact them in regards to setting up an appointment.

## 2023-03-04 NOTE — Telephone Encounter (Signed)
Patient was call left a VM to return call

## 2023-03-04 NOTE — Telephone Encounter (Signed)
Spoke with patient, patient is coming to pick up referral from the front office

## 2023-03-05 ENCOUNTER — Ambulatory Visit
Admission: RE | Admit: 2023-03-05 | Discharge: 2023-03-05 | Disposition: A | Discharge status: Home / Self Care | Payer: BC Managed Care – PPO | Source: Ambulatory Visit | Attending: Family Medicine | Admitting: Family Medicine

## 2023-03-05 ENCOUNTER — Ambulatory Visit: Payer: BC Managed Care – PPO

## 2023-03-05 DIAGNOSIS — Z1231 Encounter for screening mammogram for malignant neoplasm of breast: Secondary | ICD-10-CM

## 2023-03-10 ENCOUNTER — Telehealth: Payer: Self-pay | Admitting: Family Medicine

## 2023-03-10 NOTE — Telephone Encounter (Signed)
Patient came in to the office to pick up her referral, patient did get her cholesterol results and would like a paper copy for her referral.

## 2023-03-10 NOTE — Telephone Encounter (Deleted)
Not sure I referral can be printed. Will check with staff and give patient a call.

## 2023-03-10 NOTE — Telephone Encounter (Signed)
Patient came in to the office today to pick up her paper copy of this referral.  Patient did receive her Cholesterol results.

## 2023-03-10 NOTE — Telephone Encounter (Deleted)
Requested labs printed and taken to front office

## 2023-03-18 NOTE — Telephone Encounter (Signed)
Called patient 2x  unable to get through, I will make a copy and place it at the front desk for the patient.

## 2023-03-20 DIAGNOSIS — R011 Cardiac murmur, unspecified: Secondary | ICD-10-CM | POA: Diagnosis not present

## 2023-03-20 DIAGNOSIS — R002 Palpitations: Secondary | ICD-10-CM | POA: Diagnosis not present

## 2023-03-20 DIAGNOSIS — R1310 Dysphagia, unspecified: Secondary | ICD-10-CM | POA: Diagnosis not present

## 2023-03-31 ENCOUNTER — Ambulatory Visit: Payer: BC Managed Care – PPO | Admitting: Cardiology

## 2023-05-13 DIAGNOSIS — I471 Supraventricular tachycardia, unspecified: Secondary | ICD-10-CM | POA: Diagnosis not present

## 2023-11-07 DIAGNOSIS — M25651 Stiffness of right hip, not elsewhere classified: Secondary | ICD-10-CM | POA: Diagnosis not present

## 2023-11-07 DIAGNOSIS — M25661 Stiffness of right knee, not elsewhere classified: Secondary | ICD-10-CM | POA: Diagnosis not present

## 2023-11-07 DIAGNOSIS — M6281 Muscle weakness (generalized): Secondary | ICD-10-CM | POA: Diagnosis not present

## 2023-11-07 DIAGNOSIS — R2689 Other abnormalities of gait and mobility: Secondary | ICD-10-CM | POA: Diagnosis not present

## 2023-11-12 DIAGNOSIS — M25651 Stiffness of right hip, not elsewhere classified: Secondary | ICD-10-CM | POA: Diagnosis not present

## 2023-11-12 DIAGNOSIS — M25661 Stiffness of right knee, not elsewhere classified: Secondary | ICD-10-CM | POA: Diagnosis not present

## 2023-11-12 DIAGNOSIS — M6281 Muscle weakness (generalized): Secondary | ICD-10-CM | POA: Diagnosis not present

## 2023-11-12 DIAGNOSIS — R2689 Other abnormalities of gait and mobility: Secondary | ICD-10-CM | POA: Diagnosis not present

## 2023-11-19 DIAGNOSIS — R2689 Other abnormalities of gait and mobility: Secondary | ICD-10-CM | POA: Diagnosis not present

## 2023-11-19 DIAGNOSIS — M25651 Stiffness of right hip, not elsewhere classified: Secondary | ICD-10-CM | POA: Diagnosis not present

## 2023-11-19 DIAGNOSIS — M6281 Muscle weakness (generalized): Secondary | ICD-10-CM | POA: Diagnosis not present

## 2023-11-19 DIAGNOSIS — M25661 Stiffness of right knee, not elsewhere classified: Secondary | ICD-10-CM | POA: Diagnosis not present

## 2023-11-20 DIAGNOSIS — R2689 Other abnormalities of gait and mobility: Secondary | ICD-10-CM | POA: Diagnosis not present

## 2023-11-20 DIAGNOSIS — M6281 Muscle weakness (generalized): Secondary | ICD-10-CM | POA: Diagnosis not present

## 2023-11-20 DIAGNOSIS — M25661 Stiffness of right knee, not elsewhere classified: Secondary | ICD-10-CM | POA: Diagnosis not present

## 2023-11-20 DIAGNOSIS — M25651 Stiffness of right hip, not elsewhere classified: Secondary | ICD-10-CM | POA: Diagnosis not present

## 2023-11-25 DIAGNOSIS — R2689 Other abnormalities of gait and mobility: Secondary | ICD-10-CM | POA: Diagnosis not present

## 2023-11-25 DIAGNOSIS — M25651 Stiffness of right hip, not elsewhere classified: Secondary | ICD-10-CM | POA: Diagnosis not present

## 2023-11-25 DIAGNOSIS — M25661 Stiffness of right knee, not elsewhere classified: Secondary | ICD-10-CM | POA: Diagnosis not present

## 2023-11-25 DIAGNOSIS — M6281 Muscle weakness (generalized): Secondary | ICD-10-CM | POA: Diagnosis not present

## 2023-11-28 DIAGNOSIS — M6281 Muscle weakness (generalized): Secondary | ICD-10-CM | POA: Diagnosis not present

## 2023-11-28 DIAGNOSIS — M25661 Stiffness of right knee, not elsewhere classified: Secondary | ICD-10-CM | POA: Diagnosis not present

## 2023-11-28 DIAGNOSIS — R2689 Other abnormalities of gait and mobility: Secondary | ICD-10-CM | POA: Diagnosis not present

## 2023-11-28 DIAGNOSIS — M25651 Stiffness of right hip, not elsewhere classified: Secondary | ICD-10-CM | POA: Diagnosis not present

## 2023-12-04 DIAGNOSIS — M6281 Muscle weakness (generalized): Secondary | ICD-10-CM | POA: Diagnosis not present

## 2023-12-04 DIAGNOSIS — R2689 Other abnormalities of gait and mobility: Secondary | ICD-10-CM | POA: Diagnosis not present

## 2023-12-04 DIAGNOSIS — M25651 Stiffness of right hip, not elsewhere classified: Secondary | ICD-10-CM | POA: Diagnosis not present

## 2023-12-04 DIAGNOSIS — M25661 Stiffness of right knee, not elsewhere classified: Secondary | ICD-10-CM | POA: Diagnosis not present

## 2023-12-09 DIAGNOSIS — M25661 Stiffness of right knee, not elsewhere classified: Secondary | ICD-10-CM | POA: Diagnosis not present

## 2023-12-09 DIAGNOSIS — R2689 Other abnormalities of gait and mobility: Secondary | ICD-10-CM | POA: Diagnosis not present

## 2023-12-09 DIAGNOSIS — M25651 Stiffness of right hip, not elsewhere classified: Secondary | ICD-10-CM | POA: Diagnosis not present

## 2023-12-09 DIAGNOSIS — M6281 Muscle weakness (generalized): Secondary | ICD-10-CM | POA: Diagnosis not present

## 2023-12-11 DIAGNOSIS — M25661 Stiffness of right knee, not elsewhere classified: Secondary | ICD-10-CM | POA: Diagnosis not present

## 2023-12-11 DIAGNOSIS — M25651 Stiffness of right hip, not elsewhere classified: Secondary | ICD-10-CM | POA: Diagnosis not present

## 2023-12-11 DIAGNOSIS — M6281 Muscle weakness (generalized): Secondary | ICD-10-CM | POA: Diagnosis not present

## 2023-12-11 DIAGNOSIS — R2689 Other abnormalities of gait and mobility: Secondary | ICD-10-CM | POA: Diagnosis not present

## 2023-12-26 ENCOUNTER — Encounter: Payer: Self-pay | Admitting: *Deleted

## 2023-12-26 ENCOUNTER — Other Ambulatory Visit: Payer: Self-pay

## 2023-12-26 ENCOUNTER — Ambulatory Visit
Admission: EM | Admit: 2023-12-26 | Discharge: 2023-12-26 | Disposition: A | Discharge status: Home / Self Care | Attending: Family Medicine | Admitting: Family Medicine

## 2023-12-26 DIAGNOSIS — M79602 Pain in left arm: Secondary | ICD-10-CM | POA: Diagnosis not present

## 2023-12-26 DIAGNOSIS — M7989 Other specified soft tissue disorders: Secondary | ICD-10-CM | POA: Diagnosis not present

## 2023-12-26 MED ORDER — ACETAMINOPHEN 325 MG PO TABS: 650.0000 mg | ORAL_TABLET | Freq: Four times a day (QID) | ORAL | 0 refills | Status: AC | PRN

## 2023-12-26 MED ORDER — CYCLOBENZAPRINE HCL 5 MG PO TABS: 5.0000 mg | ORAL_TABLET | Freq: Every evening | ORAL | 0 refills | Status: AC | PRN

## 2023-12-26 NOTE — ED Triage Notes (Signed)
 PT reports Lt elbow swelling with out injury since yesterday. Pt also reports Lt upper arm discomfort.

## 2023-12-26 NOTE — Discharge Instructions (Addendum)
 Our staff secure the earliest available appointment at 2 PM on 12/29/2023.  Please arrive by 1:45 PM.  If there is any issue with the order on the day of your appointment, please call our clinic directly and request assistance from this clinic.  Otherwise you can use Tylenol  for aches and pains.  Do not use any nonsteroidal anti-inflammatories (NSAIDs) like ibuprofen, Motrin, naproxen, Aleve, etc. which are all available over-the-counter.  Cyclobenzaprine  can be used as a muscle relaxant.  If your symptoms worsen over the weekend then please present to the emergency room.  [facility] at [facility] [address], [place], [place] [address]  Phone: [phone]   Located in [facility] with free valet parking and parking deck.

## 2023-12-26 NOTE — ED Provider Notes (Signed)
 [facility]  Note:  This document was prepared using Conservation officer, historic buildings and may include unintentional dictation errors.  MRN: [id number] DOB: 02-04-1956  Subjective:   Barbara Guerrero is a 68 y.o. female presenting for 1 day history of persistent left upper extremity pain, heaviness, swelling.  The primary area of her pain is over the medial distal part of her upper extremity bordering the anterior fossa of the left elbow.  No fall, trauma, bruising, wound, drainage of pus or bleeding.  No history of musculoskeletal conditions.  No history of MI.  No history of clotting disorders.  No history of DVT.  No recent major traumas, hospitalizations.  No cancer that she is aware of.  No history of clotting disorders.   Current Facility-Administered Medications:    triamcinolone  acetonide (KENALOG ) 10 MG/ML injection 10 mg, 10 mg, Other, Once, Regal, [place] RAMAN, DPM  Current Outpatient Medications:    albuterol  (VENTOLIN  HFA) 108 (90 Base) MCG/ACT inhaler, Inhale 2 puffs into the lungs every 6 (six) hours as needed for wheezing or shortness of breath., Disp: 18 g, Rfl: 1   Cyanocobalamin (VITAMIN B 12 PO), Take 1 tablet by mouth daily., Disp: , Rfl:    fluticasone  (FLONASE ) 50 MCG/ACT nasal spray, Place 2 sprays into both nostrils daily., Disp: 16 g, Rfl: 2   acetaminophen  (TYLENOL ) 500 MG tablet, Take by mouth., Disp: , Rfl:    diclofenac  (VOLTAREN ) 75 MG EC tablet, Take 75 mg by mouth 2 (two) times daily., Disp: , Rfl:    diphenhydrAMINE (BENADRYL) 25 MG tablet, Take 25 mg by mouth every 6 (six) hours as needed., Disp: , Rfl:    EPINEPHrine  (EPIPEN  2-PAK) 0.3 mg/0.3 mL IJ SOAJ injection, EpiPen  2-Pak 0.3 mg/0.3 mL injection, auto-injector, Disp: 2 each, Rfl: 3   famotidine  (PEPCID ) 20 MG tablet, Take 1 tablet (20 mg total) by mouth 2 (two) times daily. (Patient not taking: Reported on 11/21/2022), Disp: 30 tablet, Rfl: 0   furosemide  (LASIX ) 40 MG tablet, Take  1 tablet (40 mg total) by mouth daily. (Patient not taking: Reported on 11/21/2022), Disp: 5 tablet, Rfl: 0   VENTOLIN  HFA 108 (90 Base) MCG/ACT inhaler, Inhale 2 puffs into the lungs every 6 (six) hours as needed for wheezing or shortness of breath. (Patient not taking: Reported on 11/21/2022), Disp: 18 g, Rfl: 1   Vibegron (GEMTESA) 75 MG TABS, Take 75 mg by mouth daily., Disp: 30 tablet, Rfl: 5   Allergies  Allergen Reactions   Shellfish Allergy Itching    Past Medical History:  Diagnosis Date   Amenorrhea    Asthma    Barium enema abnormal    Breast cyst    Family history of breast cancer in first degree relative    Fibroids    Hypertension    Lactose intolerance    Mastodynia    Pelvic pain in female    Rectal fissure      Past Surgical History:  Procedure Laterality Date   BRAIN SURGERY  2003   BREAST SURGERY  1977   CESAREAN SECTION  1980/1985   DIAGNOSTIC LAPAROSCOPY      Family History  Problem Relation Age of Onset   Hypertension Mother    Cancer Sister        breast cancer    Social History   Tobacco Use   Smoking status: Never   Smokeless tobacco: Never  Vaping Use   Vaping status: Never Used  Substance Use  Topics   Alcohol use: No   Drug use: No    ROS   Objective:   Vitals: BP (!) 143/81   Pulse 78   Temp 98.3 F (36.8 C)   Resp 20   SpO2 91%   Physical Exam Constitutional:      General: She is not in acute distress.    Appearance: Normal appearance. She is well-developed. She is not ill-appearing, toxic-appearing or diaphoretic.  HENT:     Head: Normocephalic and atraumatic.     Nose: Nose normal.     Mouth/Throat:     Mouth: Mucous membranes are moist.  Eyes:     General: No scleral icterus.       Right eye: No discharge.        Left eye: No discharge.     Extraocular Movements: Extraocular movements intact.  Cardiovascular:     Rate and Rhythm: Normal rate and regular rhythm.     Heart sounds: Normal heart sounds. No murmur  heard.    No friction rub. No gallop.  Pulmonary:     Effort: Pulmonary effort is normal. No respiratory distress.     Breath sounds: No stridor. No wheezing, rhonchi or rales.  Chest:     Chest wall: No tenderness.  Musculoskeletal:     Left elbow: No swelling, deformity, effusion or lacerations. Normal range of motion. Tenderness (superficial over area outlined) present. No radial head, medial epicondyle, lateral epicondyle or olecranon process tenderness.       Arms:     Comments: No bony tenderness.  Skin:    General: Skin is warm and dry.  Neurological:     General: No focal deficit present.     Mental Status: She is alert and oriented to person, place, and time.  Psychiatric:        Mood and Affect: Mood normal.        Behavior: Behavior normal.    ED ECG REPORT   Date: 12/26/2023  EKG Time: 1:24 PM  Rate: 73bpm  Rhythm: normal sinus rhythm,  normal EKG, normal sinus rhythm  Axis: normal  Intervals:none  ST&T Change: none  Narrative Interpretation: Sinus rhythm at 73 bpm, minimal voltage criteria for LVH.  Right comparable to previous EKG from 11/07/2021.   Assessment and Plan :   PDMP not reviewed this encounter.  1. Pain and swelling of left upper extremity   2. Left upper extremity swelling    EKG reassuring.  Will pursue outpatient ultrasound.  The earliest available appointment we were able to secure was for 3 days from now on 12/29/2023.  Should there be any kind of issue with the order, recommended patient call the clinic and asking the provider onsite to place.  Otherwise, for now can use Tylenol , muscle relaxant.  Follow-up with PCP. Counseled patient on potential for adverse effects with medications prescribed/recommended today, ER and return-to-clinic precautions discussed, patient verbalized understanding.    Christopher Savannah, PA-C 12/26/23 1329

## 2023-12-29 ENCOUNTER — Ambulatory Visit (HOSPITAL_COMMUNITY)
Admission: RE | Admit: 2023-12-29 | Discharge: 2023-12-29 | Disposition: A | Discharge status: Home / Self Care | Source: Ambulatory Visit | Attending: Surgery | Admitting: Surgery

## 2023-12-29 ENCOUNTER — Other Ambulatory Visit: Payer: Self-pay | Admitting: Urgent Care

## 2023-12-29 DIAGNOSIS — M79602 Pain in left arm: Secondary | ICD-10-CM | POA: Insufficient documentation

## 2023-12-29 DIAGNOSIS — M7989 Other specified soft tissue disorders: Secondary | ICD-10-CM

## 2024-01-01 ENCOUNTER — Ambulatory Visit: Payer: Self-pay | Admitting: Urgent Care

## 2024-01-21 NOTE — Telephone Encounter (Signed)
 Patient called to get scheduled but no appts available. Please advise.  [phone]

## 2024-01-22 NOTE — Telephone Encounter (Signed)
 Review of chart indicates GI referral placed 03/2023 for dysphagia - patient is currently scheduled for appointment with Dr. Emeline 08/05/2024. Patient was previously scheduled with APP 09/26/23 (no show) and 07/30/23 (cancelled).

## 2024-01-22 NOTE — Telephone Encounter (Signed)
 errror

## 2024-01-28 ENCOUNTER — Encounter (HOSPITAL_BASED_OUTPATIENT_CLINIC_OR_DEPARTMENT_OTHER): Payer: Self-pay

## 2024-01-28 ENCOUNTER — Other Ambulatory Visit: Payer: Self-pay

## 2024-01-28 ENCOUNTER — Emergency Department (HOSPITAL_BASED_OUTPATIENT_CLINIC_OR_DEPARTMENT_OTHER)
Admission: EM | Admit: 2024-01-28 | Discharge: 2024-01-28 | Disposition: A | Discharge status: Home / Self Care | Attending: Emergency Medicine | Admitting: Emergency Medicine

## 2024-01-28 DIAGNOSIS — R202 Paresthesia of skin: Secondary | ICD-10-CM | POA: Insufficient documentation

## 2024-01-28 DIAGNOSIS — M792 Neuralgia and neuritis, unspecified: Secondary | ICD-10-CM

## 2024-01-28 DIAGNOSIS — M79602 Pain in left arm: Secondary | ICD-10-CM | POA: Insufficient documentation

## 2024-01-28 LAB — CBC WITH DIFFERENTIAL/PLATELET
Abs Immature Granulocytes: 0.02 K/uL (ref 0.00–0.07)
Basophils Absolute: 0.1 K/uL (ref 0.0–0.1)
Basophils Relative: 1 %
Eosinophils Absolute: 0.1 K/uL (ref 0.0–0.5)
Eosinophils Relative: 2 %
HCT: 42.2 % (ref 36.0–46.0)
Hemoglobin: 13.5 g/dL (ref 12.0–15.0)
Immature Granulocytes: 0 %
Lymphocytes Relative: 26 %
Lymphs Abs: 1.8 K/uL (ref 0.7–4.0)
MCH: 29.2 pg (ref 26.0–34.0)
MCHC: 32 g/dL (ref 30.0–36.0)
MCV: 91.1 fL (ref 80.0–100.0)
Monocytes Absolute: 1 K/uL (ref 0.1–1.0)
Monocytes Relative: 14 %
Neutro Abs: 4.1 K/uL (ref 1.7–7.7)
Neutrophils Relative %: 57 %
Platelets: 262 K/uL (ref 150–400)
RBC: 4.63 MIL/uL (ref 3.87–5.11)
RDW: 13.9 % (ref 11.5–15.5)
WBC: 7.1 K/uL (ref 4.0–10.5)
nRBC: 0 % (ref 0.0–0.2)

## 2024-01-28 LAB — BASIC METABOLIC PANEL WITH GFR
Anion gap: 13 (ref 5–15)
BUN: 11 mg/dL (ref 8–23)
CO2: 25 mmol/L (ref 22–32)
Calcium: 9.4 mg/dL (ref 8.9–10.3)
Chloride: 102 mmol/L (ref 98–111)
Creatinine, Ser: 0.77 mg/dL (ref 0.44–1.00)
GFR, Estimated: >60 mL/min (ref >60)
Glucose, Bld: 103 mg/dL — ABNORMAL HIGH (ref 70–99)
Potassium: 3.5 mmol/L (ref 3.5–5.1)
Sodium: 140 mmol/L (ref 135–145)

## 2024-01-28 LAB — TROPONIN T, HIGH SENSITIVITY: Troponin T High Sensitivity: <15 ng/L (ref 0–19)

## 2024-01-28 NOTE — Discharge Instructions (Signed)
 1.  At this time I suspect your pain is coming from compression of nerves in your arm.  This compression can be at a number of different locations.  Common areas are in the spine from the neck, the underarm, passages of nerve bundles near the elbow and the wrist.  You will need additional tests to see if this is a problem.  I recommend you follow-up with your orthopedic specialist to soon as possible.  Call [facility] and schedule a recheck.  You may need a test called an EMG.  This tests to conduction of the nerves in an arm or leg. 2.  Also schedule follow-up with your family doctor continue monitoring your pain and symptoms.  You describe some pain under your armpit, at this time I do not find any enlarged lymph nodes.  However it is very important that you are keeping up with your mammograms and careful monitoring for any possible breast cancer. 3.  Return to the emergency department if you have worsening of your symptoms, new or concerning symptoms.  Take extra strength Tylenol  if needed for pain.

## 2024-01-28 NOTE — ED Provider Notes (Addendum)
 [facility] Provider Note   CSN: [id number] Arrival date & time: 01/28/24  9246     Patient presents with: Tingling   Barbara Guerrero is a 68 y.o. female.   Patient presents to the emergency department for evaluation of arm pain.  Patient reports that she started feeling pain near the left elbow last night.  She reports that the pain is worse this morning and the area is swollen.  Patient had a similar episode last month, was seen at urgent care.  She denies any injury to the area.  Since the swelling began she has having some tingling going up and down the arm from the elbow.  No weakness.       Prior to Admission medications   Medication Sig Start Date End Date Taking? Authorizing Provider  acetaminophen  (TYLENOL ) 325 MG tablet Take 2 tablets (650 mg total) by mouth every 6 (six) hours as needed for moderate pain (pain score 4-6). 12/26/23   Ahlam Piscitelli [place], PA-C  albuterol  (VENTOLIN  HFA) 108 (90 Base) MCG/ACT inhaler Inhale 2 puffs into the lungs every 6 (six) hours as needed for wheezing or shortness of breath. 04/06/19   Kozlow, Eric J, MD  Cyanocobalamin (VITAMIN B 12 PO) Take 1 tablet by mouth daily.    [provider]  cyclobenzaprine  (FLEXERIL ) 5 MG tablet Take 1 tablet (5 mg total) by mouth at bedtime as needed. 12/26/23   Vineeth Fell [place], PA-C  diclofenac  (VOLTAREN ) 75 MG EC tablet Take 75 mg by mouth 2 (two) times daily.    [provider]  diphenhydrAMINE (BENADRYL) 25 MG tablet Take 25 mg by mouth every 6 (six) hours as needed.    [provider]  EPINEPHrine  (EPIPEN  2-PAK) 0.3 mg/0.3 mL IJ SOAJ injection EpiPen  2-Pak 0.3 mg/0.3 mL injection, auto-injector 03/05/21   Iva Marty Saltness, MD  famotidine  (PEPCID ) 20 MG tablet Take 1 tablet (20 mg total) by mouth 2 (two) times daily. Patient not taking: Reported on 11/21/2022 11/07/21   Grettel Rames [place], PA-C  fluticasone  (FLONASE ) 50 MCG/ACT nasal spray Place 2  sprays into both nostrils daily. 02/04/17   Melonie Tori Mikel CHRISTELLA, MD  furosemide  (LASIX ) 40 MG tablet Take 1 tablet (40 mg total) by mouth daily. Patient not taking: Reported on 11/21/2022 08/30/21   Giovannina Mun [place], PA-C  VENTOLIN  HFA 108 (90 Base) MCG/ACT inhaler Inhale 2 puffs into the lungs every 6 (six) hours as needed for wheezing or shortness of breath. Patient not taking: Reported on 11/21/2022 03/05/21   Iva Marty Saltness, MD  Vibegron (GEMTESA) 75 MG TABS Take 75 mg by mouth daily. 09/18/21   Marilynne Rosaline SAILOR, MD    Allergies: Shellfish allergy    Review of Systems  Updated Vital Signs BP (!) 180/84   Pulse 90   Temp 98.2 F (36.8 C)   Resp 18   Wt 111.1 kg   SpO2 96%   BMI 41.40 kg/m   Physical Exam Vitals and nursing note reviewed.  Constitutional:      General: She is not in acute distress.    Appearance: She is well-developed.  HENT:     Head: Normocephalic and atraumatic.     Mouth/Throat:     Mouth: Mucous membranes are moist.  Eyes:     General: Vision grossly intact. Gaze aligned appropriately.     Extraocular Movements: Extraocular movements intact.     Conjunctiva/sclera: Conjunctivae normal.  Cardiovascular:     Rate and Rhythm:  Normal rate and regular rhythm.     Pulses: Normal pulses.          Radial pulses are 2+ on the left side.     Heart sounds: Normal heart sounds, S1 normal and S2 normal. No murmur heard.    No friction rub. No gallop.  Pulmonary:     Effort: Pulmonary effort is normal. No respiratory distress.     Breath sounds: Normal breath sounds.  Abdominal:     General: Bowel sounds are normal.     Palpations: Abdomen is soft.     Tenderness: There is no abdominal tenderness. There is no guarding or rebound.     Hernia: No hernia is present.  Musculoskeletal:        General: No swelling.     Left upper arm: Swelling and tenderness present.       Arms:     Cervical back: Full passive range of motion without pain, normal range of  motion and neck supple. No spinous process tenderness or muscular tenderness. Normal range of motion.     Right lower leg: No edema.     Left lower leg: No edema.     Comments: Tender fullness palpated in the deep subcutaneous region of the medial aspect of the distal bicep  Skin:    General: Skin is warm and dry.     Capillary Refill: Capillary refill takes less than 2 seconds.     Findings: No ecchymosis, erythema, rash or wound.  Neurological:     General: No focal deficit present.     Mental Status: She is alert and oriented to person, place, and time.     GCS: GCS eye subscore is 4. GCS verbal subscore is 5. GCS motor subscore is 6.     Cranial Nerves: Cranial nerves 2-12 are intact.     Sensory: Sensation is intact.     Motor: Motor function is intact.     Coordination: Coordination is intact.  Psychiatric:        Attention and Perception: Attention normal.        Mood and Affect: Mood normal.        Speech: Speech normal.        Behavior: Behavior normal.     (all labs ordered are listed, but only abnormal results are displayed) Labs Reviewed  CBC WITH DIFFERENTIAL/PLATELET  BASIC METABOLIC PANEL WITH GFR  TROPONIN T, HIGH SENSITIVITY    EKG: EKG Interpretation Date/Time:  Wednesday January 28 2024 08:22:28 EDT Ventricular Rate:  84 PR Interval:  175 QRS Duration:  114 QT Interval:  396 QTC Calculation: 469 R Axis:   33  Text Interpretation: Sinus rhythm Borderline intraventricular conduction delay Low voltage, precordial leads No significant change since last tracing Confirmed by Haze Lonni PARAS [phone]) on 01/28/2024 8:35:29 AM  Radiology: No results found.   Procedures   Medications Ordered in the ED - No data to display                                  Medical Decision Making Amount and/or Complexity of Data Reviewed Labs: ordered.   Differential diagnosis considered includes, but not limited to:  Orthopedic injury; arthritis; septic  arthritis; ischemic limb; DVT; cellulitis; phlebitis   Presents to the emergency department with recurrent pain in the left arm.  Patient has a focal area of tenderness, pain and swelling at the medial aspect  of the distal left bicep region.  No overlying skin changes, no erythema, induration.  Patient has good pulses, no concern for ischemic limb.  Record from prior visit to urgent care was reviewed.  She had a DVT study at that time that was negative, doubt DVT because of the focality at the mid aspect of the arm.  Significant tenderness makes this not consistent with referred pain or cardiac pain.  Will check EKG and a troponin.  Will perform CT scan to evaluate deep structures for abscess and mass.  Will be signed out to oncoming ER physician to follow these results.     Final diagnoses:  Arm pain, central, left    ED Discharge Orders     None          Koree Schopf, Lonni PARAS, MD 01/28/24 9185    Haze Lonni PARAS, MD 01/28/24 [phone]

## 2024-01-28 NOTE — ED Provider Notes (Signed)
  Physical Exam  BP (!) 180/84   Pulse 90   Temp 98.2 F (36.8 C)   Resp 18   Wt 111.1 kg   SpO2 96%   BMI 41.40 kg/m   Physical Exam  Procedures  Procedures  ED Course / MDM    Medical Decision Making Amount and/or Complexity of Data Reviewed Labs: ordered.   Troponin less than 15 basic metabolic panel normal CBC normal with normal differential.  EKG shows no acute ischemic changes and is consistent with prior tracing  I have extensively reviewed the patient's history regarding her arm pain and performed extensive physical exam.  Patient has had symptoms for several months.  There has been variability in symptoms.  1 predominant symptom has been intermittent paresthesias like when the arm goes to sleep from lying on it wrong that spontaneously resolved with positioning.  Patient reports she got more concerned today because she had pain that radiated up the medial arm towards her shoulder.  Although she has had a variety of symptoms in association with that, it was this pain that made her more worried.  Patient reports that she notes swelling in the arm sometimes as well.  The perception is that it makes her hand feel more tight.  Patient has a focus where most of the pain seems to occur and then the other symptoms are variable.  This focus is at the medial elbow just superior to the medial epicondyle.  At times she notes this area to seem more swollen and also tender to palpation.  I performed complete examinations patient sitting up with garments removed.  I cannot appreciate any asymmetry of the chest wall or arms.  Patient does have significant adipose tissue of the upper body and arms.  With arms extended there is a visible difference in the creasing pattern of the soft tissues of the upper arm on the left.  However, to palpation this is completely soft and there is no induration.  There is some tenderness reproducible over the medial epicondyle.  However there is no palpable mass  or induration present.  Patient has normal range of motion at the elbow.  There is no clinical evidence of a joint effusion.  She endorses some tenderness to palpation deep in the axilla however there is no appreciable lymphadenopathy.  The lower arm is soft and pliable.  No indication of any edema of the arm or hand.  Radial pulses 2+.  At this time I have high suspicion for a nerve entrapment possiblely in the neck, axilla or elbow.  I do not think that further imaging in the emergency department will show significant abnormality.  I recommend the patient follow-up with orthopedics and may need EMG or possibly MRI cervical spine or focal area upper extremity.  No emergent MRI indicated at this time.       Armenta Canning, MD 01/31/24 [phone]

## 2024-01-28 NOTE — ED Triage Notes (Signed)
 Swelling and pain in left arm that began yesterday. Pt noticed  when writing bills out yesterday. Noticed fingers tingling with radiation up arm worsening at 3 am. Similar episode last month

## 2024-02-17 DIAGNOSIS — M542 Cervicalgia: Secondary | ICD-10-CM | POA: Diagnosis not present

## 2024-02-17 DIAGNOSIS — M25512 Pain in left shoulder: Secondary | ICD-10-CM | POA: Diagnosis not present

## 2024-02-17 DIAGNOSIS — M47812 Spondylosis without myelopathy or radiculopathy, cervical region: Secondary | ICD-10-CM | POA: Diagnosis not present

## 2024-03-03 ENCOUNTER — Ambulatory Visit: Admitting: Family Medicine

## 2024-03-03 ENCOUNTER — Encounter: Payer: Self-pay | Admitting: Family Medicine

## 2024-03-03 VITALS — BP 154/92 | HR 98 | Temp 97.9°F | Ht 64.5 in | Wt 261.6 lb

## 2024-03-03 DIAGNOSIS — M47812 Spondylosis without myelopathy or radiculopathy, cervical region: Secondary | ICD-10-CM | POA: Diagnosis not present

## 2024-03-03 DIAGNOSIS — I10 Essential (primary) hypertension: Secondary | ICD-10-CM | POA: Diagnosis not present

## 2024-03-03 DIAGNOSIS — Z01419 Encounter for gynecological examination (general) (routine) without abnormal findings: Secondary | ICD-10-CM | POA: Diagnosis not present

## 2024-03-03 DIAGNOSIS — R011 Cardiac murmur, unspecified: Secondary | ICD-10-CM | POA: Diagnosis not present

## 2024-03-03 DIAGNOSIS — M792 Neuralgia and neuritis, unspecified: Secondary | ICD-10-CM | POA: Diagnosis not present

## 2024-03-03 MED ORDER — DILTIAZEM HCL ER COATED BEADS 120 MG PO CP24: 120.0000 mg | ORAL_CAPSULE | Freq: Every day | ORAL | 3 refills | Status: DC

## 2024-03-03 NOTE — Patient Instructions (Signed)
 Let me know if you would like me to place a referral to see Dr. Raford, cardiology or another cardiologist.

## 2024-03-03 NOTE — Progress Notes (Addendum)
 Established Patient Office Visit   Subjective  Patient ID: Barbara Guerrero, female    DOB: July 20, 1955  Age: 68 y.o. MRN: [id number]  Chief Complaint  Patient presents with   Medical Management of Chronic Issues    Patient came in today for a UC follow-up, patient was seen oct 15 for left arm pain     Pt is a 68 yo female seen for f/u.  Endorses swelling and discomfort in LUE at the crease, with a painful sensation radiating up the arm. Seen at [facility] 11/15, u/s performed to rule out clots.  Referred to Ortho.  C-spine x-rays done.  Has funny sensations and swelling in her left hand, with occasional numbness and pressure upon waking.  BP was 126/90 at an appt.  Endorses inconsistent eating habits x 2 months, deviating from her usual routine of yogurt, oatmeal, and snacks, and consuming more fast food. Water intake has decreased. She acknowledges weight gain and reduced physical activity over the past year.  Used to be more active, walking her dogs and gardening.  Changes in sleep pattern over the last 2 months.  Unsure of snoring.  Denies gasping for breath, but mentions environmental irritants at home affecting her comfort.  She has a history of a heart murmur and previously underwent cardiac monitoring with normal results. She does not recall having an echocardiogram. She is dissatisfied with previous care related to swallowing issues and barium swallow tests, which she felt did not address her symptoms of food getting stuck.     Patient Active Problem List   Diagnosis Date Noted   Other allergic rhinitis 04/16/2017   Allergy with anaphylaxis due to food, subsequent encounter 04/16/2017   Asthma, well controlled 04/16/2017   Family history of breast cancer in first degree relative    Past Medical History:  Diagnosis Date   Amenorrhea    Asthma    Barium enema abnormal    Breast cyst    Family history of breast cancer in first degree relative    Fibroids    Hypertension     Lactose intolerance    Mastodynia    Pelvic pain in female    Rectal fissure    Past Surgical History:  Procedure Laterality Date   BRAIN SURGERY  2003   BREAST SURGERY  1977   CESAREAN SECTION  1980/1985   DIAGNOSTIC LAPAROSCOPY     Social History   Tobacco Use   Smoking status: Never   Smokeless tobacco: Never  Vaping Use   Vaping status: Never Used  Substance Use Topics   Alcohol use: No   Drug use: No   Family History  Problem Relation Age of Onset   Hypertension Mother    Cancer Sister        breast cancer   Allergies  Allergen Reactions   Shellfish Allergy Itching    ROS Negative unless stated above    Objective:     BP (!) 154/92 (BP Location: Right Wrist, Patient Position: Sitting, Cuff Size: Normal)   Pulse 98   Temp 97.9 F (36.6 C) (Oral)   Ht 5' 4.5 (1.638 m)   Wt 261 lb 9.6 oz (118.7 kg)   SpO2 97%   BMI 44.21 kg/m  BP Readings from Last 3 Encounters:  03/03/24 (!) 154/92  01/28/24 (!) 180/84  12/26/23 (!) 143/81   Wt Readings from Last 3 Encounters:  03/03/24 261 lb 9.6 oz (118.7 kg)  01/28/24 245 lb (111.1 kg)  11/21/22  242 lb 3.2 oz (109.9 kg)      Physical Exam Constitutional:      General: She is not in acute distress.    Appearance: Normal appearance.  HENT:     Head: Normocephalic and atraumatic.     Nose: Nose normal.     Mouth/Throat:     Mouth: Mucous membranes are moist.  Cardiovascular:     Rate and Rhythm: Normal rate and regular rhythm.     Heart sounds: Murmur heard.     Systolic murmur is present with a grade of 3/6.     No gallop.  Pulmonary:     Effort: Pulmonary effort is normal. No respiratory distress.     Breath sounds: Normal breath sounds. No wheezing, rhonchi or rales.  Musculoskeletal:     Cervical back: Normal.     Thoracic back: Normal.     Lumbar back: Normal.  Skin:    General: Skin is warm and dry.  Neurological:     Mental Status: She is alert and oriented to person, place, and time.         11/21/2022   11:02 AM 02/04/2017    5:07 PM 12/04/2016   10:09 AM  Depression screen PHQ 2/9  Decreased Interest 0 0 0  Down, Depressed, Hopeless 0 0 0  PHQ - 2 Score 0 0 0  Altered sleeping 0    Tired, decreased energy 0    Change in appetite 0    Feeling bad or failure about yourself  0    Trouble concentrating 0    Moving slowly or fidgety/restless 0    Suicidal thoughts 0    PHQ-9 Score 0        Data saved with a previous flowsheet row definition      11/21/2022   11:02 AM  GAD 7 : Generalized Anxiety Score  Nervous, Anxious, on Edge 0  Control/stop worrying 0  Worry too much - different things 0  Trouble relaxing 0  Restless 0  Easily annoyed or irritable 0  Afraid - awful might happen 0  Total GAD 7 Score 0     No results found for any visits on 03/03/24.    Assessment & Plan:   Essential hypertension -     dilTIAZem  HCl ER Coated Beads; Take 1 capsule (120 mg total) by mouth daily.  Dispense: 30 capsule; Refill: 3  Murmur  Neuralgia  Cervical spondylosis  Essential hypertension   BP uncontrolled  150/92 in clinic.  Recheck.  Start diltiazem  ER 120 mg daily.  Obtain bp cuff for regular monitoring.  Advised dietary modifications to reduce salt intake and increase water consumption. Encouraged regular physical activity to aid in control.  Murmur appreciated on exam.  Consider ECHO and sleep study.  Offered referral to cardiology.  Pt wishes to wait/ decide.  Neuralgia likely 2/2 cervical spondylosis.  Continue follow-up with Ortho.  Consider EMG/NCS if needed.  Return in about 4 weeks (around 03/31/2024) for blood pressure.   Clotilda JONELLE Single, MD

## 2024-03-04 DIAGNOSIS — J301 Allergic rhinitis due to pollen: Secondary | ICD-10-CM | POA: Diagnosis not present

## 2024-03-04 DIAGNOSIS — J4520 Mild intermittent asthma, uncomplicated: Secondary | ICD-10-CM | POA: Diagnosis not present

## 2024-03-04 DIAGNOSIS — Z91013 Allergy to seafood: Secondary | ICD-10-CM | POA: Diagnosis not present

## 2024-03-04 DIAGNOSIS — H1045 Other chronic allergic conjunctivitis: Secondary | ICD-10-CM | POA: Diagnosis not present

## 2024-03-24 ENCOUNTER — Telehealth: Payer: Self-pay

## 2024-03-24 NOTE — Telephone Encounter (Signed)
 Copied from CRM #[id number]. Topic: Clinical - Red Word Triage >> Mar 24, 2024  3:44 PM Berneda FALCON wrote: Red Word that prompted transfer to Nurse Triage: Patient states the new medication is causing nausea, vomiting, and headache. States the dosage is too high. States these symptoms began when she began taking it back in November. States she does not wish to speak to a nurse, just wants a lower dose and requested I send this information over to PCP.  Medication: diltiazem  (CARDIZEM  CD) 120 MG 24 hr capsule  Pharmacy: [id number] - [place], [place] - [address] AT [address] [facility] [address] [place] [address] Phone: [phone] Fax: [phone] Hours: Not open 24 hours

## 2024-03-26 NOTE — Telephone Encounter (Signed)
 Would likely need to change medications if having continued side effects from it.  Patient was advised to follow-up in 4 weeks.  Would have her set up a follow-up appointment in person.  It appears she has an appointment on 03/31/2024.

## 2024-03-31 ENCOUNTER — Ambulatory Visit: Admitting: Family Medicine

## 2024-04-21 ENCOUNTER — Encounter: Payer: Self-pay | Admitting: Family Medicine

## 2024-04-21 ENCOUNTER — Ambulatory Visit (INDEPENDENT_AMBULATORY_CARE_PROVIDER_SITE_OTHER): Admitting: Family Medicine

## 2024-04-21 VITALS — BP 150/100 | HR 79 | Temp 98.6°F | Ht 64.5 in | Wt 251.6 lb

## 2024-04-21 DIAGNOSIS — I10 Essential (primary) hypertension: Secondary | ICD-10-CM

## 2024-04-21 DIAGNOSIS — Z8679 Personal history of other diseases of the circulatory system: Secondary | ICD-10-CM

## 2024-04-21 DIAGNOSIS — Z6841 Body Mass Index (BMI) 40.0 and over, adult: Secondary | ICD-10-CM

## 2024-04-21 MED ORDER — CARVEDILOL PHOSPHATE ER 10 MG PO CP24: 10.0000 mg | ORAL_CAPSULE | Freq: Every day | ORAL | 1 refills | Status: DC

## 2024-04-21 NOTE — Progress Notes (Signed)
 "  Established Patient Office Visit   Subjective  Patient ID: Barbara Guerrero, female    DOB: 1955-07-29  Age: 69 y.o. MRN: [id number]  Chief Complaint  Patient presents with   Medical Management of Chronic Issues    Patient came in today for a Blood pressure follow-up, patient states at home her BP is 136/85, patient staopped taking bp meds due to meds making her sick.     Patient is a 69 year old female seen for follow-up on HTN.  Patient started on Cardizem  120 mg during visit on 11/19 for HTN as previously tolerated in the past.  Pt d/c'd med d/t stomach issues including nausea and pain despite taking with food.  Patient states BP at home 136/85 last week.  Denies headaches, changes in vision, chest pain.  Started making changes to diet including going back to eating salads and yogurt.  Endorses having green beans, grilled chicken tenders, grits, and another side at [employer] last night for dinner.  Patient unsure if she snores.  Patient walks a little more while in grocery store using cart for support.  Unable to exercise due to knee pain.    Patient Active Problem List   Diagnosis Date Noted   Other allergic rhinitis 04/16/2017   Allergy with anaphylaxis due to food, subsequent encounter 04/16/2017   Asthma, well controlled 04/16/2017   Family history of breast cancer in first degree relative    Past Medical History:  Diagnosis Date   Amenorrhea    Asthma    Barium enema abnormal    Breast cyst    Family history of breast cancer in first degree relative    Fibroids    Hypertension    Lactose intolerance    Mastodynia    Pelvic pain in female    Rectal fissure    Past Surgical History:  Procedure Laterality Date   BRAIN SURGERY  2003   BREAST SURGERY  1977   CESAREAN SECTION  1980/1985   DIAGNOSTIC LAPAROSCOPY     Social History[1] Family History  Problem Relation Age of Onset   Hypertension Mother    Cancer Sister        breast cancer    Allergies[2]  ROS Negative unless stated above    Objective:     BP (!) 150/100 (BP Location: Right Arm, Patient Position: Sitting, Cuff Size: Large)   Pulse 79   Temp 98.6 F (37 C) (Oral)   Ht 5' 4.5 (1.638 m)   Wt 251 lb 9.6 oz (114.1 kg)   SpO2 97%   BMI 42.52 kg/m  BP Readings from Last 3 Encounters:  04/21/24 (!) 150/100  03/03/24 (!) 154/92  01/28/24 (!) 180/84   Wt Readings from Last 3 Encounters:  04/21/24 251 lb 9.6 oz (114.1 kg)  03/03/24 261 lb 9.6 oz (118.7 kg)  01/28/24 245 lb (111.1 kg)      Physical Exam Constitutional:      General: She is not in acute distress.    Appearance: Normal appearance.  HENT:     Head: Normocephalic and atraumatic.     Nose: Nose normal.     Mouth/Throat:     Mouth: Mucous membranes are moist.  Cardiovascular:     Rate and Rhythm: Normal rate and regular rhythm.     Heart sounds: Normal heart sounds. No murmur heard.    No gallop.  Pulmonary:     Effort: Pulmonary effort is normal. No respiratory distress.     Breath  sounds: Normal breath sounds. No wheezing, rhonchi or rales.  Skin:    General: Skin is warm and dry.  Neurological:     Mental Status: She is alert and oriented to person, place, and time.        04/21/2024   11:32 AM 11/21/2022   11:02 AM 02/04/2017    5:07 PM  Depression screen PHQ 2/9  Decreased Interest 0 0 0  Down, Depressed, Hopeless 0 0 0  PHQ - 2 Score 0 0 0  Altered sleeping 0 0   Tired, decreased energy 0 0   Change in appetite 0 0   Feeling bad or failure about yourself  0 0   Trouble concentrating 0 0   Moving slowly or fidgety/restless 0 0   Suicidal thoughts 0 0   PHQ-9 Score 0 0    Difficult doing work/chores Not difficult at all       Data saved with a previous flowsheet row definition      04/21/2024   11:32 AM 11/21/2022   11:02 AM  GAD 7 : Generalized Anxiety Score  Nervous, Anxious, on Edge 0 0  Control/stop worrying 0 0  Worry too much - different things 0 0   Trouble relaxing 0 0  Restless 0 0  Easily annoyed or irritable 0 0  Afraid - awful might happen  0  Total GAD 7 Score  0  Anxiety Difficulty Not difficult at all      No results found for any visits on 04/21/24.    Assessment & Plan:   Essential hypertension -     Carvedilol  Phosphate ER; Take 1 capsule (10 mg total) by mouth daily.  Dispense: 90 capsule; Refill: 1  Morbid obesity (HCC)  BP uncontrolled.  Recheck BP.  Cardizem  d/c'd 2/2 causing nausea, though previously tolerated.  Start Carvedilol  ER 10 mg daily.  Pt prefers a once daily dosing.  Discussed the importance of lifestyle modifications. Body mass index is 42.52 kg/m.  Patient congratulated on 10 pound weight loss.  Encouraged to continue efforts to help with overall chronic conditions and blood pressure.  Return in about 4 weeks (around 05/19/2024) for blood pressure.   Clotilda JONELLE Single, MD     [1]  Social History Tobacco Use   Smoking status: Never   Smokeless tobacco: Never  Vaping Use   Vaping status: Never Used  Substance Use Topics   Alcohol use: No   Drug use: No  [2]  Allergies Allergen Reactions   Shellfish Allergy Itching   "

## 2024-05-19 ENCOUNTER — Encounter: Payer: Self-pay | Admitting: Family Medicine

## 2024-05-19 ENCOUNTER — Ambulatory Visit: Admitting: Family Medicine

## 2024-05-19 VITALS — BP 164/90 | HR 67 | Temp 98.4°F | Ht 64.5 in | Wt 258.0 lb

## 2024-05-19 DIAGNOSIS — I10 Essential (primary) hypertension: Secondary | ICD-10-CM

## 2024-05-19 DIAGNOSIS — Z6841 Body Mass Index (BMI) 40.0 and over, adult: Secondary | ICD-10-CM

## 2024-05-19 MED ORDER — CARVEDILOL PHOSPHATE ER 20 MG PO CP24: 20.0000 mg | ORAL_CAPSULE | Freq: Every day | ORAL | 1 refills | Status: AC

## 2024-05-19 NOTE — Patient Instructions (Addendum)
 Given your continued elevation in blood pressure we will try increasing Coreg  CR from 10 mg to 20 mg daily.  Try to get a blood pressure cuff from your local pharmacy or online to monitor your blood pressure at home.  You can keep a log of the readings and bring your blood pressure cuff to your next office visit.

## 2024-05-19 NOTE — Progress Notes (Signed)
 "  Established Patient Office Visit   Subjective  Patient ID: Barbara Guerrero, female    DOB: 19-Sep-1955  Age: 69 y.o. MRN: [id number]  Chief Complaint  Patient presents with   Medical Management of Chronic Issues    Patient came in today for a 4 week follow-up, blood pressure ( BP at home 133/83)    Pt is a 69 year old female seen for follow-up.  Patient states BP at home 133/83.  Currently on Coreg  ER 10 mg daily. Taking at night without issue.  Checked bp at daughter's house, 133/83.  Plans to get a bp cuff today.   Has been in the house, sedentary for the last 2 wks due to the snow.  Knees hurting more due to the weather.  Plans to see Ortho for injections.    Patient Active Problem List   Diagnosis Date Noted   Other allergic rhinitis 04/16/2017   Allergy with anaphylaxis due to food, subsequent encounter 04/16/2017   Asthma, well controlled 04/16/2017   Family history of breast cancer in first degree relative    Past Medical History:  Diagnosis Date   Amenorrhea    Asthma    Barium enema abnormal    Breast cyst    Family history of breast cancer in first degree relative    Fibroids    Hypertension    Lactose intolerance    Mastodynia    Pelvic pain in female    Rectal fissure    Past Surgical History:  Procedure Laterality Date   BRAIN SURGERY  2003   BREAST SURGERY  1977   CESAREAN SECTION  1980/1985   DIAGNOSTIC LAPAROSCOPY     Social History[1] Family History  Problem Relation Age of Onset   Hypertension Mother    Cancer Sister        breast cancer   Allergies[2]  ROS Negative unless stated above    Objective:     BP (!) 164/90 (BP Location: Right Arm, Patient Position: Sitting, Cuff Size: Large)   Pulse 67   Temp 98.4 F (36.9 C) (Oral)   Ht 5' 4.5 (1.638 m)   Wt 258 lb (117 kg)   SpO2 98%   BMI 43.60 kg/m  BP Readings from Last 3 Encounters:  05/19/24 (!) 164/90  04/21/24 (!) 150/100  03/03/24 (!) 154/92   Wt Readings from Last 3  Encounters:  05/19/24 258 lb (117 kg)  04/21/24 251 lb 9.6 oz (114.1 kg)  03/03/24 261 lb 9.6 oz (118.7 kg)      Physical Exam Constitutional:      General: She is not in acute distress.    Appearance: Normal appearance.  HENT:     Head: Normocephalic and atraumatic.     Nose: Nose normal.     Mouth/Throat:     Mouth: Mucous membranes are moist.  Cardiovascular:     Rate and Rhythm: Normal rate and regular rhythm.     Heart sounds: Normal heart sounds. No murmur heard.    No gallop.  Pulmonary:     Effort: Pulmonary effort is normal. No respiratory distress.     Breath sounds: Normal breath sounds. No wheezing, rhonchi or rales.  Skin:    General: Skin is warm and dry.  Neurological:     Mental Status: She is alert and oriented to person, place, and time.        05/19/2024   10:41 AM 04/21/2024   11:32 AM 11/21/2022   11:02 AM  Depression  screen PHQ 2/9  Decreased Interest 0 0 0  Down, Depressed, Hopeless 0 0 0  PHQ - 2 Score 0 0 0  Altered sleeping 0 0 0  Tired, decreased energy 0 0 0  Change in appetite 0 0 0  Feeling bad or failure about yourself  0 0 0  Trouble concentrating 0 0 0  Moving slowly or fidgety/restless 0 0 0  Suicidal thoughts 0 0 0  PHQ-9 Score 0 0 0   Difficult doing work/chores Not difficult at all Not difficult at all      Data saved with a previous flowsheet row definition      05/19/2024   10:41 AM 04/21/2024   11:32 AM 11/21/2022   11:02 AM  GAD 7 : Generalized Anxiety Score  Nervous, Anxious, on Edge 0 0  0   Control/stop worrying 0 0  0   Worry too much - different things 0 0  0   Trouble relaxing 0 0  0   Restless 0 0  0   Easily annoyed or irritable 0 0  0   Afraid - awful might happen 0  0   Total GAD 7 Score 0  0  Anxiety Difficulty Not difficult at all Not difficult at all      Data saved with a previous flowsheet row definition     No results found for any visits on 05/19/24.    Assessment & Plan:   Essential  hypertension -     Carvedilol  Phosphate ER; Take 1 capsule (20 mg total) by mouth daily.  Dispense: 90 capsule; Refill: 1  Morbid obesity (HCC)  BP uncontrolled in clinic.  Recheck.  BP remained elevated.  Increase Coreg   CR to 20 mg daily.  Cartia  XT 150 mg d/c at last OFV due to nausea, though tolerated in the past.  Obtain bp cuff for home monitoring.  Body mass index is 43.6 kg/m.  Lifestyle modifications encouraged.  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  F/u with Ortho.  Return in about 5 weeks (around 06/23/2024).   Clotilda JONELLE Single, MD     [1]  Social History Tobacco Use   Smoking status: Never   Smokeless tobacco: Never  Vaping Use   Vaping status: Never Used  Substance Use Topics   Alcohol use: No   Drug use: No  [2]  Allergies Allergen Reactions   Shellfish Allergy Itching   "

## 2024-06-23 ENCOUNTER — Ambulatory Visit: Admitting: Family Medicine
